# Patient Record
Sex: Female | Born: 1945 | Race: Black or African American | Hispanic: No | Marital: Single | Smoking: Current every day smoker
Health system: Southern US, Community
[De-identification: ages and names within clinical notes are randomized; demographics above are authoritative.]

## PROBLEM LIST (undated history)

## (undated) DIAGNOSIS — K259 Gastric ulcer, unspecified as acute or chronic, without hemorrhage or perforation: Secondary | ICD-10-CM

## (undated) DIAGNOSIS — K219 Gastro-esophageal reflux disease without esophagitis: Secondary | ICD-10-CM

## (undated) DIAGNOSIS — K449 Diaphragmatic hernia without obstruction or gangrene: Secondary | ICD-10-CM

## (undated) DIAGNOSIS — Z972 Presence of dental prosthetic device (complete) (partial): Secondary | ICD-10-CM

## (undated) DIAGNOSIS — I10 Essential (primary) hypertension: Secondary | ICD-10-CM

---

## 2006-03-10 ENCOUNTER — Emergency Department: Payer: Self-pay | Admitting: Emergency Medicine

## 2007-07-27 ENCOUNTER — Ambulatory Visit: Payer: Self-pay | Admitting: Family Medicine

## 2007-10-02 ENCOUNTER — Emergency Department: Payer: Self-pay | Admitting: Emergency Medicine

## 2008-02-22 ENCOUNTER — Emergency Department: Payer: Self-pay | Admitting: Emergency Medicine

## 2012-09-29 ENCOUNTER — Emergency Department: Payer: Self-pay | Admitting: Emergency Medicine

## 2012-09-29 LAB — URINALYSIS, COMPLETE
Bilirubin,UR: NEGATIVE
Glucose,UR: NEGATIVE mg/dL (ref 0–75)
Ketone: NEGATIVE
Leukocyte Esterase: NEGATIVE
Nitrite: NEGATIVE
Ph: 8 (ref 4.5–8.0)
Protein: NEGATIVE
RBC,UR: 1 /HPF (ref 0–5)
WBC UR: 2 /HPF (ref 0–5)

## 2012-09-29 LAB — COMPREHENSIVE METABOLIC PANEL
Alkaline Phosphatase: 92 U/L (ref 50–136)
Anion Gap: 4 — ABNORMAL LOW (ref 7–16)
BUN: 10 mg/dL (ref 7–18)
Bilirubin,Total: 0.5 mg/dL (ref 0.2–1.0)
Creatinine: 0.63 mg/dL (ref 0.60–1.30)
EGFR (Non-African Amer.): >60
Potassium: 3.5 mmol/L (ref 3.5–5.1)
SGOT(AST): 34 U/L (ref 15–37)
SGPT (ALT): 37 U/L (ref 12–78)
Total Protein: 7.8 g/dL (ref 6.4–8.2)

## 2012-09-29 LAB — CBC
HCT: 39.4 % (ref 35.0–47.0)
HGB: 13.3 g/dL (ref 12.0–16.0)
MCV: 90 fL (ref 80–100)
RBC: 4.38 10*6/uL (ref 3.80–5.20)
RDW: 13.3 % (ref 11.5–14.5)

## 2012-09-29 LAB — TSH: Thyroid Stimulating Horm: 0.62 u[IU]/mL

## 2016-11-28 ENCOUNTER — Encounter: Payer: Self-pay | Admitting: Emergency Medicine

## 2016-11-28 ENCOUNTER — Emergency Department: Payer: Medicaid Other

## 2016-11-28 ENCOUNTER — Emergency Department
Admission: EM | Admit: 2016-11-28 | Discharge: 2016-11-28 | Disposition: A | Discharge status: Home / Self Care | Payer: Medicaid Other | Attending: Emergency Medicine | Admitting: Emergency Medicine

## 2016-11-28 DIAGNOSIS — F17200 Nicotine dependence, unspecified, uncomplicated: Secondary | ICD-10-CM | POA: Insufficient documentation

## 2016-11-28 DIAGNOSIS — Y9389 Activity, other specified: Secondary | ICD-10-CM | POA: Insufficient documentation

## 2016-11-28 DIAGNOSIS — W458XXA Other foreign body or object entering through skin, initial encounter: Secondary | ICD-10-CM | POA: Insufficient documentation

## 2016-11-28 DIAGNOSIS — Y929 Unspecified place or not applicable: Secondary | ICD-10-CM | POA: Diagnosis not present

## 2016-11-28 DIAGNOSIS — R05 Cough: Secondary | ICD-10-CM | POA: Insufficient documentation

## 2016-11-28 DIAGNOSIS — S80851A Superficial foreign body, right lower leg, initial encounter: Secondary | ICD-10-CM | POA: Diagnosis not present

## 2016-11-28 DIAGNOSIS — J40 Bronchitis, not specified as acute or chronic: Secondary | ICD-10-CM

## 2016-11-28 DIAGNOSIS — Y998 Other external cause status: Secondary | ICD-10-CM | POA: Diagnosis not present

## 2016-11-28 DIAGNOSIS — S80921A Unspecified superficial injury of right lower leg, initial encounter: Secondary | ICD-10-CM | POA: Diagnosis present

## 2016-11-28 MED ORDER — PREDNISONE 50 MG PO TABS: 50.0000 mg | ORAL_TABLET | Freq: Every day | ORAL | 0 refills | Status: DC

## 2016-11-28 MED ORDER — PSEUDOEPH-BROMPHEN-DM 30-2-10 MG/5ML PO SYRP: 10.0000 mL | ORAL_SOLUTION | Freq: Four times a day (QID) | ORAL | 0 refills | Status: DC | PRN

## 2016-11-28 MED ORDER — LIDOCAINE HCL (PF) 1 % IJ SOLN
10.0000 mL | Freq: Once | INTRAMUSCULAR | Status: AC
  Administered 2016-11-28: 10 mL
  Filled 2016-11-28: qty 10

## 2016-11-28 MED ORDER — CEPHALEXIN 500 MG PO CAPS: 500.0000 mg | ORAL_CAPSULE | Freq: Four times a day (QID) | ORAL | 0 refills | Status: DC

## 2016-11-28 NOTE — ED Provider Notes (Signed)
[facility] Emergency Department Provider Note  ____________________________________________  Time seen: Approximately 9:40 PM  I have reviewed the triage vital signs and the nursing notes.   HISTORY  Chief Complaint Foreign Body in Skin    HPI Loretta Wilkins is a 71 y.o. female who presents emergency department complaining of fishhook in her right lower leg. Patient reports that she was fishing when she accidentally snagged her right lower extremity with the fishhook. Patient attempted to remove at home but was unsuccessful. Tetanus shot 3 months ago.  Patient is also requesting evaluation of cough 3 weeks. She denieshis congestion, sore throat, fevers or chills, difficulty breathing. No history of COPD. No history of CHF. Patient has not tried medications at home.   History reviewed. No pertinent past medical history.  There are no active problems to display for this patient.   History reviewed. No pertinent surgical history.  Prior to Admission medications   Medication Sig Start Date End Date Taking? Authorizing Provider  brompheniramine-pseudoephedrine-DM 30-2-10 MG/5ML syrup Take 10 mLs by mouth 4 (four) times daily as needed. 11/28/16   Cuthriell, Delorise Royals, PA-C  cephALEXin (KEFLEX) 500 MG capsule Take 1 capsule (500 mg total) by mouth 4 (four) times daily. 11/28/16   Cuthriell, Delorise Royals, PA-C  predniSONE (DELTASONE) 50 MG tablet Take 1 tablet (50 mg total) by mouth daily with breakfast. 11/28/16   Cuthriell, Delorise Royals, PA-C    Allergies Patient has no known allergies.  No family history on file.  Social History Social History  Substance Use Topics  . Smoking status: Current Every Day Smoker  . Smokeless tobacco: Never Used  . Alcohol use No     Review of Systems  Constitutional: No fever/chills Eyes: No visual changes. No discharge ENT: No upper respiratory complaints. Cardiovascular: no chest pain. Respiratory: Positive  cough. No SOB. Gastrointestinal: No abdominal pain.  No nausea, no vomiting. Musculoskeletal: Negative for musculoskeletal pain. Skin: Negative for rash, abrasions, lacerations, ecchymosis. Positive for embedded fish hook to the right lower extremity Neurological: Negative for headaches, focal weakness or numbness. 10-point ROS otherwise negative.  ____________________________________________   PHYSICAL EXAM:  VITAL SIGNS: ED Triage Vitals  Enc Vitals Group     BP 11/28/16 1923 (!) 138/112     Pulse Rate 11/28/16 1923 89     Resp 11/28/16 1923 18     Temp 11/28/16 1923 98.8 F (37.1 C)     Temp Source 11/28/16 1923 Oral     SpO2 11/28/16 1923 98 %     Weight 11/28/16 1924 107 lb (48.5 kg)     Height 11/28/16 1924  (1.651 m)     Head Circumference --      Peak Flow --      Pain Score 11/28/16 1923 3     Pain Loc --      Pain Edu? --      Excl. in GC? --      Constitutional: Alert and oriented. Well appearing and in no acute distress. Eyes: Conjunctivae are normal. PERRL. EOMI. Head: Atraumatic. ENT:      Ears:       Nose: No congestion/rhinnorhea.      Mouth/Throat: Mucous membranes are moist.  Neck: No stridor.   Hematological/Lymphatic/Immunilogical: No cervical lymphadenopathy. Cardiovascular: Normal rate, regular rhythm. Normal S1 and S2.  Good peripheral circulation. Respiratory: Normal respiratory effort without tachypnea or retractions. Lungs with a few scattered coarse breath sounds. No wheezes, rales, rhonchi. No crackles.Peri Jefferson  air entry to the bases with no decreased or absent breath sounds. Musculoskeletal: Full range of motion to all extremities. No gross deformities appreciated. Neurologic:  Normal speech and language. No gross focal neurologic deficits are appreciated.  Skin:  Skin is warm, dry and intact. No rash noted. Embedded fishhook to the right lower extremity. Part and is completely area didn't skin. No bleeding at this time. Psychiatric: Mood  and affect are normal. Speech and behavior are normal. Patient exhibits appropriate insight and judgement.   ____________________________________________   LABS (all labs ordered are listed, but only abnormal results are displayed)  Labs Reviewed - No data to display ____________________________________________  EKG   ____________________________________________  RADIOLOGY Festus Barren Cuthriell, personally viewed and evaluated these images (plain radiographs) as part of my medical decision making, as well as reviewing the written report by the radiologist.  Dg Chest 2 View  Result Date: 11/28/2016 CLINICAL DATA:  Productive cough for 3 weeks EXAM: CHEST  2 VIEW COMPARISON:  09/29/2012 FINDINGS: Normal heart size and pulmonary vascularity. No focal airspace disease or consolidation in the lungs. No blunting of costophrenic angles. No pneumothorax. Mediastinal contours appear intact. 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 Degenerative changes in the spine and shoulders. IMPRESSION: No active cardiopulmonary disease. Electronically Signed   By: Burman Nieves M.D.   On: 11/28/2016 22:28    ____________________________________________    PROCEDURES  Procedure(s) performed:    .Foreign Body Removal Date/Time: 11/28/2016 10:56 PM Performed by: Gala Romney D Authorized by: Gala Romney D  Consent: Verbal consent obtained. Risks and benefits: risks, benefits and alternatives were discussed Consent given by: patient Patient understanding: patient states understanding of the procedure being performed Patient identity confirmed: verbally with patient Body area: skin General location: lower extremity Location details: right lower leg Anesthesia: local infiltration  Anesthesia: Local Anesthetic: lidocaine 1% without epinephrine Anesthetic total: 3 mL  Sedation: Patient sedated: no Patient restrained: no Patient cooperative: yes Localization  method: visualized Removal mechanism: forceps Dressing: dressing applied Tendon involvement: none Depth: subcutaneous Complexity: simple 1 objects recovered. Objects recovered: fishhook Post-procedure assessment: foreign body removed Comments: Areas anesthetized using lidocaine. The fishhook is forced to the skin using forceps, barb is removed using an electric ring cutter, remaining fishhook is retracted back into the skin. Dressing applied. Patient tolerated well.      Medications  lidocaine (PF) (XYLOCAINE) 1 % injection 10 mL (10 mLs Infiltration Given by Other 11/28/16 2231)     ____________________________________________   INITIAL IMPRESSION / ASSESSMENT AND PLAN / ED COURSE  Pertinent labs & imaging results that were available during my care of the patient were reviewed by me and considered in my medical decision making (see chart for details).  Review of the [place] CSRS was performed in accordance of the [facility] prior to dispensing any controlled drugs.     Patient's diagnosis is consistent with Embedded foreign body to the right lower extremity and bronchitis. Patient presented with an embedded fishhook which is successfully removed as described above. Patient tolerated well. She will be placed on antibiotics prophylactically. Patient status shot was up-to-date and not up-to-date at this time. Chest x-ray reveals no areas of consolidation consistent with pneumonia. Exam is reassuring. Patient does have bronchitis we treated with prednisone and cough medication.. Patient will follow up primary care as needed. Patient is given ED precautions to return to the ED for any worsening or new symptoms.     ____________________________________________  FINAL CLINICAL IMPRESSION(S) / ED DIAGNOSES  Final diagnoses:  Foreign body of right lower leg, initial encounter  Bronchitis      NEW MEDICATIONS STARTED DURING THIS VISIT:  Discharge Medication List as of 11/28/2016 11:06 PM     START taking these medications   Details  brompheniramine-pseudoephedrine-DM 30-2-10 MG/5ML syrup Take 10 mLs by mouth 4 (four) times daily as needed., Starting Mon 11/28/2016, Print    cephALEXin (KEFLEX) 500 MG capsule Take 1 capsule (500 mg total) by mouth 4 (four) times daily., Starting Mon 11/28/2016, Print    predniSONE (DELTASONE) 50 MG tablet Take 1 tablet (50 mg total) by mouth daily with breakfast., Starting Mon 11/28/2016, Print            This chart was dictated using voice recognition software/Dragon. Despite best efforts to proofread, errors can occur which can change the meaning. Any change was purely unintentional.    Racheal Patches, PA-C 11/28/16 2340    Rockne Menghini, MD 12/01/16 [phone]

## 2016-11-28 NOTE — ED Notes (Signed)

## 2016-11-28 NOTE — ED Triage Notes (Signed)
Patient to ER for c/o fishing hook stuck in right outer lower leg.

## 2016-11-28 NOTE — ED Notes (Signed)
Pt reports that she has a fishing hook in her right lower leg Pt also states that she has "a bad chest cold" - c/o productive cough (thick clear phlegm)

## 2017-01-13 ENCOUNTER — Encounter: Payer: Self-pay | Admitting: Emergency Medicine

## 2017-01-13 ENCOUNTER — Emergency Department: Payer: Medicare Other

## 2017-01-13 ENCOUNTER — Emergency Department
Admission: EM | Admit: 2017-01-13 | Discharge: 2017-01-13 | Disposition: A | Discharge status: Home / Self Care | Payer: Medicare Other | Attending: Emergency Medicine | Admitting: Emergency Medicine

## 2017-01-13 DIAGNOSIS — S8002XA Contusion of left knee, initial encounter: Secondary | ICD-10-CM

## 2017-01-13 DIAGNOSIS — Y939 Activity, unspecified: Secondary | ICD-10-CM | POA: Insufficient documentation

## 2017-01-13 DIAGNOSIS — W19XXXA Unspecified fall, initial encounter: Secondary | ICD-10-CM | POA: Diagnosis not present

## 2017-01-13 DIAGNOSIS — F17210 Nicotine dependence, cigarettes, uncomplicated: Secondary | ICD-10-CM | POA: Diagnosis not present

## 2017-01-13 DIAGNOSIS — Y92512 Supermarket, store or market as the place of occurrence of the external cause: Secondary | ICD-10-CM | POA: Diagnosis not present

## 2017-01-13 DIAGNOSIS — Y998 Other external cause status: Secondary | ICD-10-CM | POA: Insufficient documentation

## 2017-01-13 DIAGNOSIS — S39012A Strain of muscle, fascia and tendon of lower back, initial encounter: Secondary | ICD-10-CM

## 2017-01-13 DIAGNOSIS — S8992XA Unspecified injury of left lower leg, initial encounter: Secondary | ICD-10-CM | POA: Diagnosis present

## 2017-01-13 MED ORDER — TRAMADOL-ACETAMINOPHEN 37.5-325 MG PO TABS: 1.0000 | ORAL_TABLET | Freq: Four times a day (QID) | ORAL | 0 refills | Status: DC | PRN

## 2017-01-13 NOTE — ED Triage Notes (Signed)
FIRST NURSE NOTE-here because fell in [employer] this morning. Knee pain wants to get it looked at

## 2017-01-13 NOTE — ED Triage Notes (Signed)
Pt states was in [employer] reading a bottle when she slipped in vomit. Pt states she fell backwards and landed on her L hip. Pt states when she tried to get up her foot slipped out from under her and landed on her L knee. Pt c/o L knee, hip, elbow, and L lower back pain.

## 2017-01-13 NOTE — ED Provider Notes (Signed)
[facility] Emergency Department Provider Note   ____________________________________________   First MD Initiated Contact with Patient 01/13/17 1301     (approximate)  I have reviewed the triage vital signs and the nursing notes.   HISTORY  Chief Complaint Fall and Back Pain    HPI Loretta Wilkins is a 71 y.o. female patient complaining of left knee pain secondary to a fall which occurred at [facility] this morning. Patient states able to ambulate and weight-bear with moderate discomfort.Patient also complaining of left lateral back pain. No palliative measures for complaint. Patient denies radicular component to her back pain. Patient denies bladder bowel dysfunction.   History reviewed. No pertinent past medical history.  There are no active problems to display for this patient.   History reviewed. No pertinent surgical history.  Prior to Admission medications   Medication Sig Start Date End Date Taking? Authorizing Provider  brompheniramine-pseudoephedrine-DM 30-2-10 MG/5ML syrup Take 10 mLs by mouth 4 (four) times daily as needed. 11/28/16   Cuthriell, Delorise Royals, PA-C  cephALEXin (KEFLEX) 500 MG capsule Take 1 capsule (500 mg total) by mouth 4 (four) times daily. 11/28/16   Cuthriell, Delorise Royals, PA-C  predniSONE (DELTASONE) 50 MG tablet Take 1 tablet (50 mg total) by mouth daily with breakfast. 11/28/16   Cuthriell, Delorise Royals, PA-C  traMADol-acetaminophen (ULTRACET) 37.5-325 MG tablet Take 1 tablet by mouth every 6 (six) hours as needed. 01/13/17   Joni Reining, PA-C    Allergies Patient has no known allergies.  No family history on file.  Social History Social History  Substance Use Topics  . Smoking status: Current Every Day Smoker    Packs/day: 1.00    Types: Cigarettes  . Smokeless tobacco: Never Used  . Alcohol use No    Review of Systems Constitutional: No fever/chills Eyes: No visual changes. ENT: No sore  throat. Cardiovascular: Denies chest pain. Respiratory: Denies shortness of breath. Gastrointestinal: No abdominal pain.  No nausea, no vomiting.  No diarrhea.  No constipation. Genitourinary: Negative for dysuria. Musculoskeletal: Left anterior knee pain  Skin: Negative for rash. Neurological: Negative for headaches, focal weakness or numbness.   ____________________________________________   PHYSICAL EXAM:  VITAL SIGNS: ED Triage Vitals [01/13/17 1258]  Enc Vitals Group     BP (!) 145/85     Pulse Rate (!) 102     Resp 18     Temp 98.2 F (36.8 C)     Temp Source Oral     SpO2 97 %     Weight      Height      Head Circumference      Peak Flow      Pain Score      Pain Loc      Pain Edu?      Excl. in GC?    Constitutional: Alert and oriented. Well appearing and in no acute distress. Neck: No stridor.  No cervical spine tenderness to palpation. Cardiovascular: Normal rate, regular rhythm. Grossly normal heart sounds.  Good peripheral circulation. Respiratory: Normal respiratory effort.  No retractions. Lungs CTAB. Musculoskeletal:  deformity lumbar spine. Patient is moderate guarding palpation of the paraspinal muscle area. Patient has full l range of motion of the lumbar spine. Patient is negative straight leg test. No deformity to the left knee. Mild crepitus palpation. No obvious effusion. Patient has full equal range of motion.  Neurologic:  Normal speech and language. No gross focal neurologic deficits are appreciated. No gait instability.  Skin:  Skin is warm, dry and intact. No rash noted. Psychiatric: Mood and affect are normal. Speech and behavior are normal.  ____________________________________________   LABS (all labs ordered are listed, but only abnormal results are displayed)  Labs Reviewed - No data to display ____________________________________________  EKG   ____________________________________________  RADIOLOGY  Dg Knee Complete 4 Views  Left  Result Date: 01/13/2017 CLINICAL DATA:  71 y/o  F; fall with left lateral knee pain. EXAM: LEFT KNEE - COMPLETE 4+ VIEW COMPARISON:  None. FINDINGS: No evidence of fracture, dislocation, or joint effusion. Spurring of tibial spines. Superior patellar enthesophyte. Soft tissues are unremarkable. IMPRESSION: 1. No acute fracture or dislocation identified. 2. Minimal osteoarthrosis of the knee. Electronically Signed   By: Mitzi HansenLance  Furusawa-Stratton M.D.   On: 01/13/2017 13:53   No acute findings x-ray of the left knee. ____________________________________________   PROCEDURES  Procedure(s) performed: None  Procedures  Critical Care performed: No  ____________________________________________   INITIAL IMPRESSION / ASSESSMENT AND PLAN / ED COURSE  As part of my medical decision making, I reviewed the following data within the electronic MEDICAL RECORD NUMBER    Patient presents with back and left knee [facility] Discussed x-ray finding with patient. Patient given discharge care instructions and advised [facility] if condition persists.      ____________________________________________   FINAL CLINICAL IMPRESSION(S) / ED DIAGNOSES  Final diagnoses:  Contusion of left knee, initial encounter  Strain of lumbar region, initial encounter      NEW MEDICATIONS STARTED DURING THIS VISIT:  New Prescriptions   TRAMADOL-ACETAMINOPHEN (ULTRACET) 37.5-325 MG TABLET    Take 1 tablet by mouth every 6 (six) hours as needed.     Note:  This document was prepared using Dragon voice recognition software and may include unintentional dictation errors.    Joni ReiningSmith, Ronald K, PA-C 01/13/17 1408    Schaevitz, Myra Rudeavid Matthew, MD 01/13/17 [phone]

## 2017-01-24 ENCOUNTER — Emergency Department
Admission: EM | Admit: 2017-01-24 | Discharge: 2017-01-24 | Disposition: A | Discharge status: Home / Self Care | Payer: Medicare Other | Attending: Student in an Organized Health Care Education/Training Program | Admitting: Student in an Organized Health Care Education/Training Program

## 2017-01-24 ENCOUNTER — Other Ambulatory Visit: Payer: Self-pay

## 2017-01-24 ENCOUNTER — Emergency Department: Payer: Medicare Other

## 2017-01-24 DIAGNOSIS — Y929 Unspecified place or not applicable: Secondary | ICD-10-CM | POA: Diagnosis not present

## 2017-01-24 DIAGNOSIS — M545 Low back pain: Secondary | ICD-10-CM | POA: Diagnosis not present

## 2017-01-24 DIAGNOSIS — X58XXXA Exposure to other specified factors, initial encounter: Secondary | ICD-10-CM | POA: Insufficient documentation

## 2017-01-24 DIAGNOSIS — Y999 Unspecified external cause status: Secondary | ICD-10-CM | POA: Insufficient documentation

## 2017-01-24 DIAGNOSIS — Z79899 Other long term (current) drug therapy: Secondary | ICD-10-CM | POA: Insufficient documentation

## 2017-01-24 DIAGNOSIS — F17210 Nicotine dependence, cigarettes, uncomplicated: Secondary | ICD-10-CM | POA: Diagnosis not present

## 2017-01-24 DIAGNOSIS — Y9389 Activity, other specified: Secondary | ICD-10-CM | POA: Diagnosis not present

## 2017-01-24 DIAGNOSIS — M5417 Radiculopathy, lumbosacral region: Secondary | ICD-10-CM | POA: Diagnosis not present

## 2017-01-24 DIAGNOSIS — M25552 Pain in left hip: Secondary | ICD-10-CM | POA: Diagnosis present

## 2017-01-24 NOTE — ED Triage Notes (Signed)
Pt states about 11 days ago she fell on floor at [employer]. C/o L leg, knee, hip, low back pain that wraps around to abd. Pt states she has been walking around at home. Alert and oriented.

## 2017-01-24 NOTE — Discharge Instructions (Signed)
Please follow up with her primary care provider if symptoms are not improving over the week.  You may have some improvement of your pain if you use a heating pad.  Return to the emergency department for symptoms that change or worsen if you're unable to schedule an appointment.

## 2017-01-24 NOTE — ED Provider Notes (Signed)
[facility] Emergency Department Provider Note ____________________________________________  Time seen: Approximately 3:23 PM  I have reviewed the triage vital signs and the nursing notes.   HISTORY  Chief Complaint Fall    HPI Loretta Wilkins is a 71 y.o. female who presents to the emergency department for treatment and evaluation of left side hip and lower back pain after sustaining a mechanical, non-syncopal fall11 days ago. She was evaluated here after the incident and an image of her knee was obtained at that time which did not show any abnormalities. Since then, she has been evaluated by her primary care provider who recommended that she come to the hospital for additional images. She presents today with outpatient orders, but requests to be seen in the emergency department instead.  History reviewed. No pertinent past medical history.  There are no active problems to display for this patient.   History reviewed. No pertinent surgical history.  Prior to Admission medications   Medication Sig Start Date End Date Taking? Authorizing Provider  brompheniramine-pseudoephedrine-DM 30-2-10 MG/5ML syrup Take 10 mLs by mouth 4 (four) times daily as needed. 11/28/16   Cuthriell, Delorise RoyalsJonathan D, PA-C  cephALEXin (KEFLEX) 500 MG capsule Take 1 capsule (500 mg total) by mouth 4 (four) times daily. 11/28/16   Cuthriell, Delorise RoyalsJonathan D, PA-C  predniSONE (DELTASONE) 50 MG tablet Take 1 tablet (50 mg total) by mouth daily with breakfast. 11/28/16   Cuthriell, Delorise RoyalsJonathan D, PA-C  traMADol-acetaminophen (ULTRACET) 37.5-325 MG tablet Take 1 tablet by mouth every 6 (six) hours as needed. 01/13/17   Joni ReiningSmith, Ronald K, PA-C    Allergies Patient has no known allergies.  History reviewed. No pertinent family history.  Social History Social History   Tobacco Use  . Smoking status: Current Every Day Smoker    Packs/day: 1.00    Types: Cigarettes  . Smokeless tobacco: Never Used   Substance Use Topics  . Alcohol use: No  . Drug use: No    Review of Systems Constitutional: Negative for recent illness. Cardiovascular: Negative for chest pain Respiratory: Negative for shortness of breath Musculoskeletal: Positive for left lower back and left hip pain Skin: Negative for rash, lesion, or when  Neurological: Positive for intermittent radiculopathy into the left hip  ____________________________________________   PHYSICAL EXAM:  VITAL SIGNS: ED Triage Vitals  Enc Vitals Group     BP 01/24/17 1233 (!) 134/109     Pulse Rate 01/24/17 1233 (!) 103     Resp 01/24/17 1233 18     Temp 01/24/17 1233 98.8 F (37.1 C)     Temp Source 01/24/17 1233 Oral     SpO2 01/24/17 1233 98 %     Weight 01/24/17 1234 170 lb (77.1 kg)     Height 01/24/17 1234 5\' 5"  (1.651 m)     Head Circumference --      Peak Flow --      Pain Score 01/24/17 1233 8     Pain Loc --      Pain Edu? --      Excl. in GC? --     Constitutional: Alert and oriented. Well appearing and in no acute distress. Eyes: Conjunctivae are clear without discharge or drainage  Head: Atraumatic Neck: Full, active range of motion was observed Respiratory: Respirations are even and unlabored. Breath sounds are clear to auscultation. Musculoskeletal: Tenderness elicited with palpation over the left lumbar area and hip. Patient is able to bear weight, turn over in bed, and sit up  without assistance. Neurologic: Straight leg raise is negative bilaterally.  Skin: No wound noted on exposed skin surfaces.  Psychiatric: Behavior and affect are appropriate.  ____________________________________________   LABS (all labs ordered are listed, but only abnormal results are displayed)  Labs Reviewed - No data to display ____________________________________________  RADIOLOGY  Exam of the lumbar spine reveals no acute compression fracture or other acute bony abnormalities. Exam of the left hip is also negative for  acute bony abnormalities per radiology. ____________________________________________   PROCEDURES  Procedure(s) performed: None  ____________________________________________   INITIAL IMPRESSION / ASSESSMENT AND PLAN / ED COURSE  Loretta Wilkins is a 71 y.o. female who presents to the emergency department for evaluation and treatment of back and hip pain after sustaining a mechanical, non-syncopal fall about 11 days ago. She will be encouraged to continue taking the medications as prescribed by her primary care provider and at her ER visit when the injury occurred. She will be instructed to follow-up with her primary care provider for symptoms that are not improving over the next week or so. She was instructed to return to the emergency department if her symptoms change or worsen and she is unable schedule an appointment.  Pertinent labs & imaging results that were available during my care of the patient were reviewed by me and considered in my medical decision making (see chart for details).  _________________________________________   FINAL CLINICAL IMPRESSION(S) / ED DIAGNOSES  Final diagnoses:  Radicular pain of lumbosacral region    If controlled substance prescribed during this visit, 12 month history viewed on the [place] prior to issuing an initial prescription for Schedule II or III opiod.    Chinita Pesterriplett, Orville Widmann B, FNP 01/24/17 1531    Willy Eddyobinson, Patrick, MD 01/24/17 [phone]

## 2017-02-07 ENCOUNTER — Other Ambulatory Visit: Payer: Self-pay | Admitting: Family Medicine

## 2017-02-07 DIAGNOSIS — Z1231 Encounter for screening mammogram for malignant neoplasm of breast: Secondary | ICD-10-CM

## 2017-11-22 ENCOUNTER — Other Ambulatory Visit: Payer: Self-pay | Admitting: Family Medicine

## 2017-11-22 DIAGNOSIS — Z1231 Encounter for screening mammogram for malignant neoplasm of breast: Secondary | ICD-10-CM

## 2018-02-12 ENCOUNTER — Observation Stay
Admission: EM | Admit: 2018-02-12 | Discharge: 2018-02-14 | Disposition: A | Discharge status: Home / Self Care | Payer: Medicare HMO | Attending: Internal Medicine | Admitting: Internal Medicine

## 2018-02-12 ENCOUNTER — Observation Stay: Payer: Medicare HMO | Admitting: Certified Registered Nurse Anesthetist

## 2018-02-12 ENCOUNTER — Encounter: Payer: Self-pay | Admitting: Medical Oncology

## 2018-02-12 ENCOUNTER — Other Ambulatory Visit: Payer: Self-pay

## 2018-02-12 ENCOUNTER — Emergency Department: Payer: Medicare HMO

## 2018-02-12 DIAGNOSIS — K2210 Ulcer of esophagus without bleeding: Principal | ICD-10-CM | POA: Insufficient documentation

## 2018-02-12 DIAGNOSIS — K449 Diaphragmatic hernia without obstruction or gangrene: Secondary | ICD-10-CM | POA: Insufficient documentation

## 2018-02-12 DIAGNOSIS — I252 Old myocardial infarction: Secondary | ICD-10-CM | POA: Diagnosis not present

## 2018-02-12 DIAGNOSIS — E876 Hypokalemia: Secondary | ICD-10-CM | POA: Diagnosis not present

## 2018-02-12 DIAGNOSIS — Z79899 Other long term (current) drug therapy: Secondary | ICD-10-CM | POA: Diagnosis not present

## 2018-02-12 DIAGNOSIS — Z8711 Personal history of peptic ulcer disease: Secondary | ICD-10-CM | POA: Insufficient documentation

## 2018-02-12 DIAGNOSIS — K2100 Gastro-esophageal reflux disease with esophagitis, without bleeding: Secondary | ICD-10-CM

## 2018-02-12 DIAGNOSIS — F17210 Nicotine dependence, cigarettes, uncomplicated: Secondary | ICD-10-CM | POA: Diagnosis not present

## 2018-02-12 DIAGNOSIS — E860 Dehydration: Secondary | ICD-10-CM | POA: Diagnosis not present

## 2018-02-12 DIAGNOSIS — Z8719 Personal history of other diseases of the digestive system: Secondary | ICD-10-CM | POA: Insufficient documentation

## 2018-02-12 DIAGNOSIS — K920 Hematemesis: Secondary | ICD-10-CM

## 2018-02-12 DIAGNOSIS — R0789 Other chest pain: Secondary | ICD-10-CM | POA: Diagnosis not present

## 2018-02-12 HISTORY — DX: Gastric ulcer, unspecified as acute or chronic, without hemorrhage or perforation: K25.9

## 2018-02-12 HISTORY — DX: Diaphragmatic hernia without obstruction or gangrene: K44.9

## 2018-02-12 HISTORY — DX: Gastro-esophageal reflux disease without esophagitis: K21.9

## 2018-02-12 LAB — LIPASE, BLOOD: Lipase: 37 U/L (ref 11–51)

## 2018-02-12 LAB — COMPREHENSIVE METABOLIC PANEL
ALK PHOS: 73 U/L (ref 38–126)
ALT: 30 U/L (ref 0–44)
ANION GAP: 16 — AB (ref 5–15)
AST: 37 U/L (ref 15–41)
Albumin: 4.3 g/dL (ref 3.5–5.0)
BILIRUBIN TOTAL: 1 mg/dL (ref 0.3–1.2)
BUN: 37 mg/dL — ABNORMAL HIGH (ref 8–23)
CALCIUM: 10.5 mg/dL — AB (ref 8.9–10.3)
CO2: 34 mmol/L — AB (ref 22–32)
CREATININE: 0.87 mg/dL (ref 0.44–1.00)
Chloride: 94 mmol/L — ABNORMAL LOW (ref 98–111)
GFR calc non Af Amer: >60 mL/min (ref >60)
Glucose, Bld: 138 mg/dL — ABNORMAL HIGH (ref 70–99)
Potassium: 3.5 mmol/L (ref 3.5–5.1)
Sodium: 144 mmol/L (ref 135–145)
TOTAL PROTEIN: 9.2 g/dL — AB (ref 6.5–8.1)

## 2018-02-12 LAB — TYPE AND SCREEN
ABO/RH(D): O POS
Antibody Screen: NEGATIVE

## 2018-02-12 LAB — HEMOGLOBIN AND HEMATOCRIT, BLOOD
HCT: 41.9 % (ref 36.0–46.0)
Hemoglobin: 13.7 g/dL (ref 12.0–15.0)

## 2018-02-12 LAB — CBC WITH DIFFERENTIAL/PLATELET
ABS IMMATURE GRANULOCYTES: 0.09 10*3/uL — AB (ref 0.00–0.07)
Basophils Absolute: 0 10*3/uL (ref 0.0–0.1)
Basophils Relative: 0 %
EOS PCT: 0 %
Eosinophils Absolute: 0 10*3/uL (ref 0.0–0.5)
HEMATOCRIT: 48.8 % — AB (ref 36.0–46.0)
HEMOGLOBIN: 15.8 g/dL — AB (ref 12.0–15.0)
Immature Granulocytes: 1 %
LYMPHS PCT: 10 %
Lymphs Abs: 1 10*3/uL (ref 0.7–4.0)
MCH: 28.6 pg (ref 26.0–34.0)
MCHC: 32.4 g/dL (ref 30.0–36.0)
MCV: 88.2 fL (ref 80.0–100.0)
MONO ABS: 0.7 10*3/uL (ref 0.1–1.0)
MONOS PCT: 7 %
NEUTROS ABS: 8.4 10*3/uL — AB (ref 1.7–7.7)
Neutrophils Relative %: 82 %
Platelets: 226 10*3/uL (ref 150–400)
RBC: 5.53 MIL/uL — ABNORMAL HIGH (ref 3.87–5.11)
RDW: 15.7 % — ABNORMAL HIGH (ref 11.5–15.5)
WBC: 10.2 10*3/uL (ref 4.0–10.5)
nRBC: 0 % (ref 0.0–0.2)

## 2018-02-12 LAB — TROPONIN I

## 2018-02-12 LAB — HEMOGLOBIN: Hemoglobin: 15.3 g/dL — ABNORMAL HIGH (ref 12.0–15.0)

## 2018-02-12 MED ORDER — HYDRALAZINE HCL 20 MG/ML IJ SOLN
10.0000 mg | Freq: Four times a day (QID) | INTRAMUSCULAR | Status: DC | PRN
  Administered 2018-02-12 – 2018-02-13 (×2): 10 mg via INTRAVENOUS
  Filled 2018-02-12 (×2): qty 1

## 2018-02-12 MED ORDER — SODIUM CHLORIDE 0.9 % IV SOLN
INTRAVENOUS | Status: DC
  Administered 2018-02-12 – 2018-02-13 (×2): via INTRAVENOUS
  Administered 2018-02-13: 1000 mL via INTRAVENOUS

## 2018-02-12 MED ORDER — SODIUM CHLORIDE 0.9 % IV SOLN
80.0000 mg | Freq: Once | INTRAVENOUS | Status: AC
  Administered 2018-02-12: 80 mg via INTRAVENOUS
  Filled 2018-02-12: qty 80

## 2018-02-12 MED ORDER — ONDANSETRON HCL 4 MG/2ML IJ SOLN
4.0000 mg | Freq: Four times a day (QID) | INTRAMUSCULAR | Status: DC | PRN
  Administered 2018-02-12 – 2018-02-13 (×2): 4 mg via INTRAVENOUS
  Filled 2018-02-12 (×2): qty 2

## 2018-02-12 MED ORDER — SODIUM CHLORIDE 0.9 % IV SOLN
Freq: Once | INTRAVENOUS | Status: AC
  Administered 2018-02-12: 12:00:00 via INTRAVENOUS

## 2018-02-12 MED ORDER — ONDANSETRON HCL 4 MG/2ML IJ SOLN
4.0000 mg | Freq: Once | INTRAMUSCULAR | Status: AC
  Administered 2018-02-12: 4 mg via INTRAVENOUS
  Filled 2018-02-12: qty 2

## 2018-02-12 MED ORDER — PANTOPRAZOLE SODIUM 40 MG IV SOLR: 40.0000 mg | Freq: Two times a day (BID) | INTRAVENOUS | Status: DC

## 2018-02-12 MED ORDER — MORPHINE SULFATE (PF) 2 MG/ML IV SOLN
2.0000 mg | INTRAVENOUS | Status: DC | PRN
  Administered 2018-02-12 (×2): 2 mg via INTRAVENOUS
  Filled 2018-02-12 (×2): qty 1

## 2018-02-12 MED ORDER — HYDRALAZINE HCL 20 MG/ML IJ SOLN
INTRAMUSCULAR | Status: AC
  Filled 2018-02-12: qty 1

## 2018-02-12 MED ORDER — SODIUM CHLORIDE 0.9 % IV SOLN
8.0000 mg/h | INTRAVENOUS | Status: DC
  Administered 2018-02-12 – 2018-02-13 (×4): 8 mg/h via INTRAVENOUS
  Filled 2018-02-12 (×5): qty 80

## 2018-02-12 NOTE — Progress Notes (Signed)
Advanced care plan.  Purpose of the Encounter: CODE STATUS  Parties in Attendance: Patient herself  Patient's Decision Capacity: Intact  Subjective/Patient's story:  Patient 72 year old with history of GERD and gastric ulcers presents with hematemesis with possible GI bleed  Objective/Medical story Due to patient's advanced age I discussed with her regarding her desires for cardiac and pulmonary resuscitation   Goals of care determination:  Patient states that she would like everything to be done including CPR intubation   CODE STATUS:  Full code  Time spent discussing advanced care planning: 16 minutes

## 2018-02-12 NOTE — ED Provider Notes (Signed)
[facility] Emergency Department Provider Note       Time seen: ----------------------------------------- 9:10 AM on 02/12/2018 -----------------------------------------   I have reviewed the triage vital signs and the nursing notes.  HISTORY   Chief Complaint No chief complaint on file.    HPI Loretta Wilkins is a 72 y.o. female with a history of hiatal hernia, gastric ulcers and GERD who presents to the ED for metaphysis.  Patient reports she is been vomiting since Thursday, which is 5 days ago.  She has been unable to keep anything down during that time.  Typically every time she throws up she throws up a lot of blood.  She feels weak, reports vomiting dark-colored emesis.  She reports soreness to her abdomen and chest.  She had one bowel movement this morning that was dark.  She reports she had restarted some of her acid reflux medicine but it caused her to vomit.  No past medical history on file.  There are no active problems to display for this patient.   No past surgical history on file.  Allergies Patient has no known allergies.  Social History Social History   Tobacco Use  . Smoking status: Current Every Day Smoker    Packs/day: 1.00    Types: Cigarettes  . Smokeless tobacco: Never Used  Substance Use Topics  . Alcohol use: No  . Drug use: No   Review of Systems Constitutional: Negative for fever. Cardiovascular: Negative for chest pain. Respiratory: Negative for shortness of breath. Gastrointestinal: Positive for abdominal soreness, hematemesis Musculoskeletal: Negative for back pain. Skin: Negative for rash. Neurological: Negative for headaches, positive for weakness  All systems negative/normal/unremarkable except as stated in the HPI  ____________________________________________   PHYSICAL EXAM:  VITAL SIGNS: ED Triage Vitals  Enc Vitals Group     BP      Pulse      Resp      Temp      Temp src      SpO2    Weight      Height      Head Circumference      Peak Flow      Pain Score      Pain Loc      Pain Edu?      Excl. in GC?    Constitutional: Alert and oriented. Well appearing and in no distress. Eyes: Conjunctivae are normal. Normal extraocular movements. ENT   Head: Normocephalic and atraumatic.   Nose: No congestion/rhinnorhea.   Mouth/Throat: Mucous membranes are moist.   Neck: No stridor. Cardiovascular: Normal rate, regular rhythm. No murmurs, rubs, or gallops. Respiratory: Normal respiratory effort without tachypnea nor retractions. Breath sounds are clear and equal bilaterally. No wheezes/rales/rhonchi. Gastrointestinal: Soft and nontender. Normal bowel sounds Musculoskeletal: Nontender with normal range of motion in extremities. No lower extremity tenderness nor edema. Neurologic:  Normal speech and language. No gross focal neurologic deficits are appreciated.  Skin:  Skin is warm, dry and intact. No rash noted. Psychiatric: Mood and affect are normal. Speech and behavior are normal.  ____________________________________________  EKG: Interpreted by me.  Sinus tachycardia with a rate of 104 bpm, left atrial enlargement, left axis deviation, possible anterior infarct is old  ____________________________________________  ED COURSE:  As part of my medical decision making, I reviewed the following data within the electronic MEDICAL RECORD NUMBER History obtained from family if available, nursing notes, old chart and ekg, as well as notes from prior ED visits. Patient presented  for hematemesis, we will assess with labs and imaging as indicated at this time.   Procedures ____________________________________________   LABS (pertinent positives/negatives)  Labs Reviewed  CBC WITH DIFFERENTIAL/PLATELET - Abnormal; Notable for the following components:      Result Value   RBC 5.53 (*)    Hemoglobin 15.8 (*)    HCT 48.8 (*)    RDW 15.7 (*)    Neutro Abs 8.4 (*)     Abs Immature Granulocytes 0.09 (*)    All other components within normal limits  COMPREHENSIVE METABOLIC PANEL - Abnormal; Notable for the following components:   Chloride 94 (*)    CO2 34 (*)    Glucose, Bld 138 (*)    BUN 37 (*)    Calcium 10.5 (*)    Total Protein 9.2 (*)    Anion gap 16 (*)    All other components within normal limits  LIPASE, BLOOD  TROPONIN I  TYPE AND SCREEN   CRITICAL CARE Performed by: Ulice Dash   Total critical care time: 30 minutes  Critical care time was exclusive of separately billable procedures and treating other patients.  Critical care was necessary to treat or prevent imminent or life-threatening deterioration.  Critical care was time spent personally by me on the following activities: development of treatment plan with patient and/or surrogate as well as nursing, discussions with consultants, evaluation of patient's response to treatment, examination of patient, obtaining history from patient or surrogate, ordering and performing treatments and interventions, ordering and review of laboratory studies, ordering and review of radiographic studies, pulse oximetry and re-evaluation of patient's condition.  RADIOLOGY Images were viewed by me  Acute abdominal series Does not reveal any acute process ____________________________________________  DIFFERENTIAL DIAGNOSIS   Gastric ulcer, gastritis, anemia, acute blood loss, hiatal hernia, medication noncompliance  FINAL ASSESSMENT AND PLAN  Hematemesis   Plan: The patient had presented for hematemesis.  Patient was started on IV Protonix by bolus and continuous infusion.  She also received IV Zofran.  Patient's labs presently did not reveal any anemia but her BUN is elevated suggestive of GI bleeding. Patient's imaging was unremarkable.  I will discuss with the hospitalist for admission and likely GI consultation.   Ulice Dash, MD   Note: This note was generated in part  or whole with voice recognition software. Voice recognition is usually quite accurate but there are transcription errors that can and very often do occur. I apologize for any typographical errors that were not detected and corrected.     Emily Filbert, MD 02/12/18 1054

## 2018-02-12 NOTE — ED Notes (Signed)
Pt visualized in NAD, requesting pillow and water. EDP at bedside at this time. This RN explained patient NPO until further notice. Pt states understanding. WIll continue to monitor for further patient needs.

## 2018-02-12 NOTE — H&P (Signed)
[employer] [facility] [place] [facility]   PATIENT NAME: Loretta Wilkins    MR#:  [id number]  DATE OF BIRTH:  June 23, 1945  DATE OF ADMISSION:  02/12/2018  PRIMARY CARE PHYSICIAN: [facility]   REQUESTING/REFERRING PHYSICIAN: Emily FilbertWilliams, Jonathan E, MD  CHIEF COMPLAINT:   Chief Complaint  Patient presents with  . Hematemesis    HISTORY OF PRESENT ILLNESS: Loretta Wilkins  is a 72 y.o. female with a known history of GERD, hiatal hernia, gastric ulcers in the past presents with throwing up dark material for the past 2 days.  Patient states that she thinks it may have been blood.  She has had multiple episodes of this.  She states that since coming to the ER has stopped.  Patient states that she does take ibuprofen occasionally but does not take NSAIDs on a routine basis.  She otherwise denies any chest pain or shortness of breath  PAST MEDICAL HISTORY:   Past Medical History:  Diagnosis Date  . GERD (gastroesophageal reflux disease)   . Hiatal hernia   . Multiple gastric ulcers     PAST SURGICAL HISTORY: History reviewed. No pertinent surgical history.  SOCIAL HISTORY:  Social History   Tobacco Use  . Smoking status: Current Every Day Smoker    Packs/day: 1.00    Types: Cigarettes  . Smokeless tobacco: Never Used  Substance Use Topics  . Alcohol use: No    FAMILY HISTORY:  Family History  Problem Relation Age of Onset  . Hypertension Mother     DRUG ALLERGIES: No Known Allergies  REVIEW OF SYSTEMS:   CONSTITUTIONAL: No fever, fatigue or weakness.  EYES: No blurred or double vision.  EARS, NOSE, AND THROAT: No tinnitus or ear pain.  RESPIRATORY: No cough, shortness of breath, wheezing or hemoptysis.  CARDIOVASCULAR: No chest pain, orthopnea, edema.  GASTROINTESTINAL: Positive nausea, positive vomiting, no diarrhea or abdominal pain.  GENITOURINARY: No dysuria, hematuria.  ENDOCRINE: No polyuria, nocturia,  HEMATOLOGY: No anemia, easy  bruising or bleeding SKIN: No rash or lesion. MUSCULOSKELETAL: No joint pain or arthritis.   NEUROLOGIC: No tingling, numbness, weakness.  PSYCHIATRY: No anxiety or depression.   MEDICATIONS AT HOME:  Prior to Admission medications   Medication Sig Start Date End Date Taking? Authorizing Provider  Omeprazole 20 MG TBDD Take 20 mg by mouth daily as needed.   Yes [provider]  brompheniramine-pseudoephedrine-DM 30-2-10 MG/5ML syrup Take 10 mLs by mouth 4 (four) times daily as needed. Patient not taking: Reported on 02/12/2018 11/28/16   Cuthriell, Delorise RoyalsJonathan D, PA-C  cephALEXin (KEFLEX) 500 MG capsule Take 1 capsule (500 mg total) by mouth 4 (four) times daily. Patient not taking: Reported on 02/12/2018 11/28/16   Cuthriell, Delorise RoyalsJonathan D, PA-C  traMADol-acetaminophen (ULTRACET) 37.5-325 MG tablet Take 1 tablet by mouth every 6 (six) hours as needed. 01/13/17   Joni ReiningSmith, Ronald K, PA-C      PHYSICAL EXAMINATION:   VITAL SIGNS: Blood pressure (!) 132/97, pulse (!) 129, temperature (!) 97.5 F (36.4 C), temperature source Oral, resp. rate (!) 24, weight 77 kg, SpO2 97 %.  GENERAL:  72 y.o.-year-old patient lying in the bed with no acute distress.  EYES: Pupils equal, round, reactive to light and accommodation. No scleral icterus. Extraocular muscles intact.  HEENT: Head atraumatic, normocephalic. Oropharynx and nasopharynx clear.  NECK:  Supple, no jugular venous distention. No thyroid enlargement, no tenderness.  LUNGS: Normal breath sounds bilaterally, no wheezing, rales,rhonchi or crepitation. No use of accessory  muscles of respiration.  CARDIOVASCULAR: S1, S2 normal. No murmurs, rubs, or gallops.  ABDOMEN: Soft, nontender, nondistended. Bowel sounds present. No organomegaly or mass.  EXTREMITIES: No pedal edema, cyanosis, or clubbing.  NEUROLOGIC: Cranial nerves II through XII are intact. Muscle strength 5/5 in all extremities. Sensation intact. Gait not checked.  PSYCHIATRIC: The  patient is alert and oriented x 3.  SKIN: No obvious rash, lesion, or ulcer.   LABORATORY PANEL:   CBC Recent Labs  Lab 02/12/18 0916  WBC 10.2  HGB 15.8*  HCT 48.8*  PLT 226  MCV 88.2  MCH 28.6  MCHC 32.4  RDW 15.7*  LYMPHSABS 1.0  MONOABS 0.7  EOSABS 0.0  BASOSABS 0.0   ------------------------------------------------------------------------------------------------------------------  Chemistries  Recent Labs  Lab 02/12/18 0916  NA 144  K 3.5  CL 94*  CO2 34*  GLUCOSE 138*  BUN 37*  CREATININE 0.87  CALCIUM 10.5*  AST 37  ALT 30  ALKPHOS 73  BILITOT 1.0   ------------------------------------------------------------------------------------------------------------------ CrCl cannot be calculated (Unknown ideal weight.). ------------------------------------------------------------------------------------------------------------------ No results for input(s): TSH, T4TOTAL, T3FREE, THYROIDAB in the last 72 hours.  Invalid input(s): FREET3   Coagulation profile No results for input(s): INR, PROTIME in the last 168 hours. ------------------------------------------------------------------------------------------------------------------- No results for input(s): DDIMER in the last 72 hours. -------------------------------------------------------------------------------------------------------------------  Cardiac Enzymes Recent Labs  Lab 02/12/18 0916  TROPONINI <0.03   ------------------------------------------------------------------------------------------------------------------ Invalid input(s): POCBNP  ---------------------------------------------------------------------------------------------------------------  Urinalysis    Component Value Date/Time   COLORURINE Straw 09/29/2012 1247   APPEARANCEUR Clear 09/29/2012 1247   LABSPEC 1.005 09/29/2012 1247   PHURINE 8.0 09/29/2012 1247   GLUCOSEU Negative 09/29/2012 1247   HGBUR Negative  09/29/2012 1247   BILIRUBINUR Negative 09/29/2012 1247   KETONESUR Negative 09/29/2012 1247   PROTEINUR Negative 09/29/2012 1247   NITRITE Negative 09/29/2012 1247   LEUKOCYTESUR Negative 09/29/2012 1247     RADIOLOGY: Dg Abdomen Acute W/chest  Result Date: 02/12/2018 CLINICAL DATA:  Vomiting for 5 days. EXAM: DG ABDOMEN ACUTE W/ 1V CHEST COMPARISON:  Chest x-ray 11/28/2016 FINDINGS: The upright chest x-ray demonstrates normal cardiomediastinal silhouette. Mild tortuosity of the thoracic aorta. There is a moderate to large hiatal hernia again demonstrated. No acute pulmonary findings. Two views of the abdomen demonstrate an unremarkable bowel gas pattern. No findings to suggest obstruction or perforation. The soft tissue shadows are maintained. No worrisome calcifications. Scattered vascular calcifications. The bony structures are intact. IMPRESSION: 1. No acute cardiopulmonary findings. 2. Moderate to large hiatal hernia. 3. No plain film findings for an acute abdominal process. Electronically Signed   By: Rudie Meyer M.D.   On: 02/12/2018 11:15    EKG: Orders placed or performed during the hospital encounter of 02/12/18  . EKG 12-Lead  . EKG 12-Lead    IMPRESSION AND PLAN: Patient is 72 year old presenting with emesis  1.  Upper GI bleed due to gastritis and/or gastric ulcer Continue n.p.o. Status IV fluid Check hemoglobin transfuse as needed I have sent the GI physician on call a message regarding patient's admission  2.  Elevated anion gap likely due to dehydration we will give IV fluids   3.  Hypercalcemia likely due to dehydration we will follow her calcium in the morning  4.  Nicotine abuse smoking cessation provided 4 minutes spent strongly recommend she stop smoking nicotine patch offered patient did not want a nicotine patch  All the records are reviewed and case discussed with ED provider. Management plans discussed with the patient, family and they are  in  agreement.  CODE STATUS: Full code    TOTAL TIME TAKING CARE OF THIS PATIENT: 55 minutes.    Auburn Bilberry M.D on 02/12/2018 at 11:45 AM  Between 7am to 6pm - Pager - [phone]  After 6pm go to [email or web address] - password [email or web address]  [employer] Office  [phone]  CC: Primary care physician; [facility] [employer]

## 2018-02-12 NOTE — ED Triage Notes (Signed)
Pt reports since Thursday she has been vomiting dark colored emesis. Pt reports soreness to abd and chest. Denies diarrhea, one BM this am was dark.

## 2018-02-12 NOTE — ED Notes (Signed)
Pt transported to floor by Wilford Sportsassie, RN.

## 2018-02-12 NOTE — Consult Note (Signed)
Cephas Darby, MD [address]  [place]  [place], [place] [address]  Main: [phone]  Fax: [phone] Pager: [phone]   Consultation  Referring Provider:     No ref. provider found Primary Care Physician:  [facility], [facility] Primary Gastroenterologist: None         Reason for Consultation:     Coffee-ground emesis  Date of Admission:  02/12/2018 Date of Consultation:  02/12/2018         HPI:   Loretta Wilkins is a 72 y.o. female with history of peptic ulcer disease, hiatal hernia presented to ER today secondary to several episodes of coffee-ground emesis since Thursday associated with upper abdominal pain and chest discomfort.  Patient had dark bowel movement today.  She was found to have elevated BUN, however, her hemoglobin was 15.8 with normal platelets.  Baseline hemoglobin 13.3 in 09/2012.  EKG and troponin were unremarkable, lipase was normal, LFTs normal.  Patient is started on pantoprazole drip and GI is consulted for further evaluation Patient started taking omeprazole that she had at home since onset of the symptoms. Patient does not have history of cirrhosis or chronic liver disease Patient has moderate to large hiatal hernia based on chest x-ray today Patient reports that she was found to have gastric ulcers several years ago.  She reports having had an upper endoscopy in [place].  NSAIDs: She has been taking ibuprofen 2-3 times a week up to 3 pills a day for headaches  Antiplts/Anticoagulants/Anti thrombotics: None  GI Procedures: Unknown  Past Medical History:  Diagnosis Date  . GERD (gastroesophageal reflux disease)   . Hiatal hernia   . Multiple gastric ulcers     History reviewed. No pertinent surgical history.  Prior to Admission medications   Medication Sig Start Date End Date Taking? Authorizing Provider  Omeprazole 20 MG TBDD Take 20 mg by mouth daily as needed.   Yes [provider]    brompheniramine-pseudoephedrine-DM 30-2-10 MG/5ML syrup Take 10 mLs by mouth 4 (four) times daily as needed. Patient not taking: Reported on 02/12/2018 11/28/16   Cuthriell, Charline Bills, PA-C  cephALEXin (KEFLEX) 500 MG capsule Take 1 capsule (500 mg total) by mouth 4 (four) times daily. Patient not taking: Reported on 02/12/2018 11/28/16   Cuthriell, Charline Bills, PA-C  traMADol-acetaminophen (ULTRACET) 37.5-325 MG tablet Take 1 tablet by mouth every 6 (six) hours as needed. 01/13/17   Sable Feil, PA-C    Current Facility-Administered Medications:  .  0.9 %  sodium chloride infusion, , Intravenous, Continuous, Dustin Flock, MD .  hydrALAZINE (APRESOLINE) injection 10 mg, 10 mg, Intravenous, Q6H PRN, Dustin Flock, MD, 10 mg at 02/12/18 1259 .  morphine 2 MG/ML injection 2 mg, 2 mg, Intravenous, Q4H PRN, Dustin Flock, MD, 2 mg at 02/12/18 1437 .  ondansetron (ZOFRAN) injection 4 mg, 4 mg, Intravenous, Q6H PRN, Dustin Flock, MD, 4 mg at 02/12/18 1436 .  pantoprazole (PROTONIX) 80 mg in sodium chloride 0.9 % 250 mL (0.32 mg/mL) infusion, 8 mg/hr, Intravenous, Continuous, Dustin Flock, MD, Last Rate: 25 mL/hr at 02/12/18 1001, 8 mg/hr at 02/12/18 1001 .  [START ON 02/15/2018] pantoprazole (PROTONIX) injection 40 mg, 40 mg, Intravenous, Q12H, Dustin Flock, MD  Family History  Problem Relation Age of Onset  . Hypertension Mother      Social History   Tobacco Use  . Smoking status: Current Every Day Smoker    Packs/day: 1.00    Types: Cigarettes  .  Smokeless tobacco: Never Used  Substance Use Topics  . Alcohol use: No  . Drug use: No    Allergies as of 02/12/2018  . (No Known Allergies)    Review of Systems:    All systems reviewed and negative except where noted in HPI.   Physical Exam:  Vital signs in last 24 hours: Temp:  [97.5 F (36.4 C)-98.8 F (37.1 C)] 98.8 F (37.1 C) (12/02 1337) Pulse Rate:  [107-129] 119 (12/02 1337) Resp:  [18-24] 24 (12/02  1337) BP: (132-184)/(92-132) 173/95 (12/02 1337) SpO2:  [90 %-97 %] 96 % (12/02 1337) Weight:  [66.4 kg-77 kg] 66.4 kg (12/02 1337) Last BM Date: 02/12/18 General:   Pleasant, cooperative in NAD Head:  Normocephalic and atraumatic. Eyes:   No icterus.   Conjunctiva pink. PERRLA. Ears:  Normal auditory acuity. Neck:  Supple; no masses or thyroidomegaly Lungs: Respirations even and unlabored. Lungs clear to auscultation bilaterally.   No wheezes, crackles, or rhonchi.  Heart:  Regular rate and rhythm;  Without murmur, clicks, rubs or gallops Abdomen:  Soft, nondistended, nontender. Normal bowel sounds. No appreciable masses or hepatomegaly.  No rebound or guarding.  Rectal:  Not performed. Msk:  Symmetrical without gross deformities.  Strength normal Extremities:  Without edema, cyanosis or clubbing. Neurologic:  Alert and oriented x3;  grossly normal neurologically. Skin:  Intact without significant lesions or rashes. Psych:  Alert and cooperative. Normal affect.  LAB RESULTS: CBC Latest Ref Rng & Units 02/12/2018 02/12/2018 09/29/2012  WBC 4.0 - 10.5 K/uL - 10.2 4.5  Hemoglobin 12.0 - 15.0 g/dL 15.3(H) 15.8(H) 13.3  Hematocrit 36.0 - 46.0 % - 48.8(H) 39.4  Platelets 150 - 400 K/uL - 226 205    BMET BMP Latest Ref Rng & Units 02/12/2018 09/29/2012  Glucose 70 - 99 mg/dL 138(H) 76  BUN 8 - 23 mg/dL 37(H) 10  Creatinine 0.44 - 1.00 mg/dL 0.87 0.63  Sodium 135 - 145 mmol/L 144 140  Potassium 3.5 - 5.1 mmol/L 3.5 3.5  Chloride 98 - 111 mmol/L 94(L) 107  CO2 22 - 32 mmol/L 34(H) 29  Calcium 8.9 - 10.3 mg/dL 10.5(H) 9.1    LFT Hepatic Function Latest Ref Rng & Units 02/12/2018 09/29/2012  Total Protein 6.5 - 8.1 g/dL 9.2(H) 7.8  Albumin 3.5 - 5.0 g/dL 4.3 3.3(L)  AST 15 - 41 U/L 37 34  ALT 0 - 44 U/L 30 37  Alk Phosphatase 38 - 126 U/L 73 92  Total Bilirubin 0.3 - 1.2 mg/dL 1.0 0.5     STUDIES: Dg Abdomen Acute W/chest  Result Date: 02/12/2018 CLINICAL DATA:  Vomiting for 5  days. EXAM: DG ABDOMEN ACUTE W/ 1V CHEST COMPARISON:  Chest x-ray 11/28/2016 FINDINGS: The upright chest x-ray demonstrates normal cardiomediastinal silhouette. Mild tortuosity of the thoracic aorta. There is a moderate to large hiatal hernia again demonstrated. No acute pulmonary findings. Two views of the abdomen demonstrate an unremarkable bowel gas pattern. No findings to suggest obstruction or perforation. The soft tissue shadows are maintained. No worrisome calcifications. Scattered vascular calcifications. The bony structures are intact. IMPRESSION: 1. No acute cardiopulmonary findings. 2. Moderate to large hiatal hernia. 3. No plain film findings for an acute abdominal process. Electronically Signed   By: Marijo Sanes M.D.   On: 02/12/2018 11:15      Impression / Plan:   Loretta Wilkins is a 72 y.o. African-American female with known history of peptic ulcer disease, hiatal hernia presented with 4 days history of  coffee-ground emesis and dark stools associated with elevated BUN.  However with normal hemoglobin.  Patient might be hemoconcentrated at this time.  X-ray chest and abdomen revealed moderate to large hiatal hernia.  There was no evidence of free air on KUB.  Patient has history of NSAID use  Upper GI bleed: Continue pantoprazole drip Check H&H now Okay with clear liquid diet N.p.o. past midnight EGD today Monitor CBC and transfuse as needed  I have discussed alternative options, risks & benefits,  which include, but are not limited to, bleeding, infection, perforation,respiratory complication & drug reaction.  The patient agrees with this plan & written consent will be obtained.     Thank you for involving me in the care of this patient.      LOS: 0 days   Sherri Sear, MD  02/12/2018, 4:25 PM   Note: This dictation was prepared with Dragon dictation along with smaller phrase technology. Any transcriptional errors that result from this process are unintentional.

## 2018-02-12 NOTE — ED Notes (Signed)
Admitting MD paged regarding patient's BP 184/109. Will await return phone call prior to transporting patient to floor.

## 2018-02-13 ENCOUNTER — Encounter
Admission: EM | Disposition: A | Discharge status: Home / Self Care | Payer: Self-pay | Source: Home / Self Care | Attending: Emergency Medicine

## 2018-02-13 DIAGNOSIS — K2210 Ulcer of esophagus without bleeding: Secondary | ICD-10-CM | POA: Diagnosis not present

## 2018-02-13 LAB — CBC
HCT: 39.4 % (ref 36.0–46.0)
Hemoglobin: 12.6 g/dL (ref 12.0–15.0)
MCH: 28.8 pg (ref 26.0–34.0)
MCHC: 32 g/dL (ref 30.0–36.0)
MCV: 90 fL (ref 80.0–100.0)
PLATELETS: 164 10*3/uL (ref 150–400)
RBC: 4.38 MIL/uL (ref 3.87–5.11)
RDW: 15.5 % (ref 11.5–15.5)
WBC: 7 10*3/uL (ref 4.0–10.5)
nRBC: 0 % (ref 0.0–0.2)

## 2018-02-13 LAB — BASIC METABOLIC PANEL
Anion gap: 8 (ref 5–15)
BUN: 22 mg/dL (ref 8–23)
CO2: 30 mmol/L (ref 22–32)
CREATININE: 0.67 mg/dL (ref 0.44–1.00)
Calcium: 8.5 mg/dL — ABNORMAL LOW (ref 8.9–10.3)
Chloride: 102 mmol/L (ref 98–111)
GFR calc Af Amer: >60 mL/min (ref >60)
GFR calc non Af Amer: >60 mL/min (ref >60)
Glucose, Bld: 91 mg/dL (ref 70–99)
Potassium: 2.7 mmol/L — CL (ref 3.5–5.1)
Sodium: 140 mmol/L (ref 135–145)

## 2018-02-13 LAB — POTASSIUM
Potassium: 3.9 mmol/L (ref 3.5–5.1)
Potassium: 3.1 mmol/L — ABNORMAL LOW (ref 3.5–5.1)

## 2018-02-13 LAB — MAGNESIUM: Magnesium: 1.9 mg/dL (ref 1.7–2.4)

## 2018-02-13 LAB — GLUCOSE, CAPILLARY: Glucose-Capillary: 79 mg/dL (ref 70–99)

## 2018-02-13 SURGERY — EGD (ESOPHAGOGASTRODUODENOSCOPY)
Anesthesia: General

## 2018-02-13 MED ORDER — MOMETASONE FURO-FORMOTEROL FUM 200-5 MCG/ACT IN AERO
2.0000 | INHALATION_SPRAY | Freq: Two times a day (BID) | RESPIRATORY_TRACT | Status: DC
  Administered 2018-02-13 – 2018-02-14 (×3): 2 via RESPIRATORY_TRACT
  Filled 2018-02-13: qty 8.8

## 2018-02-13 MED ORDER — POTASSIUM CHLORIDE 10 MEQ/100ML IV SOLN
10.0000 meq | INTRAVENOUS | Status: AC
  Administered 2018-02-13 (×4): 10 meq via INTRAVENOUS
  Filled 2018-02-13 (×3): qty 100

## 2018-02-13 MED ORDER — POTASSIUM CHLORIDE 20 MEQ PO PACK
40.0000 meq | PACK | Freq: Once | ORAL | Status: AC
  Administered 2018-02-13: 09:00:00 40 meq via ORAL
  Filled 2018-02-13: qty 2

## 2018-02-13 MED ORDER — ACETAMINOPHEN 325 MG PO TABS: 650.0000 mg | ORAL_TABLET | Freq: Four times a day (QID) | ORAL | Status: DC | PRN

## 2018-02-13 MED ORDER — MORPHINE SULFATE (PF) 2 MG/ML IV SOLN: 2.0000 mg | Freq: Four times a day (QID) | INTRAVENOUS | Status: DC | PRN

## 2018-02-13 MED ORDER — POTASSIUM CHLORIDE 20 MEQ PO PACK
20.0000 meq | PACK | ORAL | Status: AC
  Administered 2018-02-13 (×2): 20 meq via ORAL
  Filled 2018-02-13 (×2): qty 1

## 2018-02-13 MED ORDER — SODIUM CHLORIDE 0.9 % IV SOLN: INTRAVENOUS | Status: DC

## 2018-02-13 MED ORDER — POTASSIUM CHLORIDE 20 MEQ PO PACK
40.0000 meq | PACK | Freq: Two times a day (BID) | ORAL | Status: DC
  Administered 2018-02-13: 40 meq via ORAL
  Filled 2018-02-13: qty 2

## 2018-02-13 MED ORDER — HYDROCOD POLST-CPM POLST ER 10-8 MG/5ML PO SUER
5.0000 mL | Freq: Two times a day (BID) | ORAL | Status: DC
  Administered 2018-02-13 (×2): 5 mL via ORAL
  Filled 2018-02-13 (×2): qty 5

## 2018-02-13 NOTE — Care Management Obs Status (Signed)
MEDICARE OBSERVATION STATUS NOTIFICATION   Patient Details  Name: Loretta PorterRosie S Skow MRN: [id number] Date of Birth: Jul 13, 1945   Medicare Observation Status Notification Given:  Yes    Gwenette GreetBrenda S Noor Witte, RN 02/13/2018, 9:42 AM

## 2018-02-13 NOTE — Progress Notes (Signed)
Pharmacy Electrolyte Monitoring Consult:  Pharmacy consulted to assist in monitoring and replacing electrolytes in this 72 y.o. female admitted on 02/12/2018 with Hematemesis   Labs:  Sodium (mmol/L)  Date Value  02/13/2018 140  09/29/2012 140   Potassium (mmol/L)  Date Value  02/13/2018 3.1 (L)  09/29/2012 3.5   Magnesium (mg/dL)  Date Value  19/14/782912/05/2017 1.9   Calcium (mg/dL)  Date Value  56/21/308612/05/2017 8.5 (L)   Calcium, Total (mg/dL)  Date Value  57/84/696207/19/2014 9.1   Albumin (g/dL)  Date Value  95/28/413212/04/2017 4.3  09/29/2012 3.3 (L)    Assessment/Plan: K 2.7 this AM with recheck of 3.9 after KCl 40 mEq PO x2 doses and 4 runs of IV 10 mEq  Rechecked at 1700 per MD (suspected falsely elevated due to proximity to K runs)   Standing KCl order stopped. Per MD, supplement potassium if low.   When rechecked 12/3 @ 1700, potassium was 3.1  Gave 20 meq po x 2 - Recheck with 12/4 am labs and replete if low.  Pharmacy will continue to follow.   Albina Billetharles M Andi Mahaffy, PharmD Clinical Pharmacist 02/13/2018 6:18 PM

## 2018-02-13 NOTE — Progress Notes (Signed)
CRITICAL VALUE STICKER  CRITICAL VALUE: Potassium 2.7  RECEIVER (on-site recipient of call):Cung Masterson  DATE & TIME NOTIFIED: 02/13/18, 0515  MESSENGER (representative from lab):  MD NOTIFIED: Dr Sheryle Hailiamond  TIME OF NOTIFICATION:02/13/18 0520  RESPONSE: New orders written

## 2018-02-13 NOTE — Progress Notes (Signed)
Pharmacy Electrolyte Monitoring Consult:  Pharmacy consulted to assist in monitoring and replacing electrolytes in this 72 y.o. female admitted on 02/12/2018 with Hematemesis   Labs:  Sodium (mmol/L)  Date Value  02/13/2018 140  09/29/2012 140   Potassium (mmol/L)  Date Value  02/13/2018 3.9  09/29/2012 3.5   Magnesium (mg/dL)  Date Value  16/10/960412/05/2017 1.9   Calcium (mg/dL)  Date Value  54/09/811912/05/2017 8.5 (L)   Calcium, Total (mg/dL)  Date Value  14/78/295607/19/2014 9.1   Albumin (g/dL)  Date Value  21/30/865712/04/2017 4.3  09/29/2012 3.3 (L)    Assessment/Plan: K 2.7 this AM with recheck of 3.9 after KCl 40 mEq PO x2 doses and 4 runs of IV 10 mEq  Will recheck at 1700 per MD orders as the K of 3.9 may be falsely elevated due to proximity to K runs. Standing KCl order stopped. Per MD, supplement potassium if low.   Pharmacy will continue to follow.   Marty HeckWang, Leobardo Granlund L 02/13/2018 3:13 PM

## 2018-02-13 NOTE — Progress Notes (Signed)
[facility] - [place] at [facility]   PATIENT NAME: Loretta KaufmannRosie Wilkins    MR#:  [id number]  DATE OF BIRTH:  11-07-1945  SUBJECTIVE:  CHIEF COMPLAINT:   Chief Complaint  Patient presents with  . Hematemesis   -Came in with throwing up blood.  Complains of significant cough and wheezing today.  REVIEW OF SYSTEMS:  Review of Systems  Constitutional: Negative for chills, fever and malaise/fatigue.  HENT: Negative for hearing loss.   Respiratory: Positive for cough and shortness of breath. Negative for wheezing.   Cardiovascular: Negative for chest pain and palpitations.  Gastrointestinal: Negative for abdominal pain, constipation, diarrhea, nausea and vomiting.       Hematemesis  Genitourinary: Negative for dysuria.  Musculoskeletal: Negative for myalgias.  Neurological: Negative for dizziness, focal weakness, seizures, weakness and headaches.  Psychiatric/Behavioral: Negative for depression.    DRUG ALLERGIES:  No Known Allergies  VITALS:  Blood pressure (!) 154/91, pulse 99, temperature 98.9 F (37.2 C), temperature source Oral, resp. rate (!) 22, height 5\' 4"  (1.626 m), weight 66.4 kg, SpO2 97 %.  PHYSICAL EXAMINATION:  Physical Exam   GENERAL:  72 y.o.-year-old patient lying in the bed with no acute distress.  EYES: Pupils equal, round, reactive to light and accommodation. No scleral icterus. Extraocular muscles intact.  HEENT: Head atraumatic, normocephalic. Oropharynx and nasopharynx clear.  NECK:  Supple, no jugular venous distention. No thyroid enlargement, no tenderness.  LUNGS: Normal breath sounds bilaterally, no wheezing, rales,rhonchi or crepitation.  Does have some minimal expiratory wheezing all over the lung fields.  No use of accessory muscles of respiration.  CARDIOVASCULAR: S1, S2 normal. No murmurs, rubs, or gallops.  ABDOMEN: Soft, nontender, nondistended. Bowel sounds present. No organomegaly or mass.  EXTREMITIES: No pedal edema,  cyanosis, or clubbing.  NEUROLOGIC: Cranial nerves II through XII are intact. Muscle strength 5/5 in all extremities. Sensation intact. Gait not checked.  PSYCHIATRIC: The patient is alert and oriented x 3.  SKIN: No obvious rash, lesion, or ulcer.    LABORATORY PANEL:   CBC Recent Labs  Lab 02/13/18 0423  WBC 7.0  HGB 12.6  HCT 39.4  PLT 164   ------------------------------------------------------------------------------------------------------------------  Chemistries  Recent Labs  Lab 02/12/18 0916 02/13/18 0423  NA 144 140  K 3.5 2.7*  CL 94* 102  CO2 34* 30  GLUCOSE 138* 91  BUN 37* 22  CREATININE 0.87 0.67  CALCIUM 10.5* 8.5*  AST 37  --   ALT 30  --   ALKPHOS 73  --   BILITOT 1.0  --    ------------------------------------------------------------------------------------------------------------------  Cardiac Enzymes Recent Labs  Lab 02/12/18 0916  TROPONINI <0.03   ------------------------------------------------------------------------------------------------------------------  RADIOLOGY:  Dg Abdomen Acute W/chest  Result Date: 02/12/2018 CLINICAL DATA:  Vomiting for 5 days. EXAM: DG ABDOMEN ACUTE W/ 1V CHEST COMPARISON:  Chest x-ray 11/28/2016 FINDINGS: The upright chest x-ray demonstrates normal cardiomediastinal silhouette. Mild tortuosity of the thoracic aorta. There is a moderate to large hiatal hernia again demonstrated. No acute pulmonary findings. Two views of the abdomen demonstrate an unremarkable bowel gas pattern. No findings to suggest obstruction or perforation. The soft tissue shadows are maintained. No worrisome calcifications. Scattered vascular calcifications. The bony structures are intact. IMPRESSION: 1. No acute cardiopulmonary findings. 2. Moderate to large hiatal hernia. 3. No plain film findings for an acute abdominal process. Electronically Signed   By: Rudie MeyerP.  Gallerani M.D.   On: 02/12/2018 11:15    EKG:   Orders placed or  performed during the hospital encounter of 02/12/18  . EKG 12-Lead  . EKG 12-Lead    ASSESSMENT AND PLAN:   72 year old female with past medical history significant for peptic ulcer disease, hiatal hernia and prior history of gastric ulcers presents to hospital secondary to melena and hematemesis.  1.  Upper GI bleed-melena and hematemesis -Prior history of gastric ulcers.  Uses NSAIDs at home -Continue IV Protonix -Appreciate GI consult -For EGD today. -Chest x-ray with moderate to large hiatal hernia  2.  Hypokalemia-secondary to GI losses.  Being replaced aggressively. -Check a magnesium level  3.  Acute anemia-secondary to GI losses.  Hemoglobin dropped from 15 to 12.6, partly dilutional. -Stable, no active bleeding.  Monitor  4.  Dry cough and wheezing- cxr with no acute findings - will add cough meds and inhalers  5. DVT Prophylaxis- TEDs and SCDs No heparin products due to GI bleed   All the records are reviewed and case discussed with Care Management/Social Workerr. Management plans discussed with the patient, family and they are in agreement.  CODE STATUS: Full Code  TOTAL TIME TAKING CARE OF THIS PATIENT: 39 minutes.   POSSIBLE D/C IN 1-2 DAYS, DEPENDING ON CLINICAL CONDITION.   Enid Baas M.D on 02/13/2018 at 10:30 AM  Between 7am to 6pm - Pager - [phone]  After 6pm go to [email or web address] - Social research officer, government  [facility]  Office  [phone]  CC: Primary care physician; [employer]

## 2018-02-13 NOTE — Care Management Note (Signed)
Case Management Note  Patient Details  Name: Rolland PorterRosie S Bowdish MRN: [id number] Date of Birth: [birth year]  Subjective/Objective:    Admitted to [facility] under observation status with the diagnosis of upper GI Bleed. Lives with roommate. Son is Delila Pereyrayrone [phone]). Last seen Dr. Rinaldo CloudSelvage at [facility] 2 months ago. Pres[facility].  No home Health. No skilled facility. No home oxygen. Rolling walker, wheelchair, and crutches in the home if needed. Marcy Salvo(Raymond, common law husband expired 3 weeks ago).  Takes care of all basic activities of daily living herself, drives. No falls. Decreased appetite since [place] expired. Daughter/son will transport                Action/Plan: No transition of care needs identified at this time Updated insurance care faxed to insurance verification.   Expected Discharge Date:                  Expected Discharge Plan:     In-House Referral:     Discharge planning Services     Post Acute Care Choice:    Choice offered to:     DME Arranged:    DME Agency:     HH Arranged:    HH Agency:     Status of Service:     If discussed at [employer] of [employer] Meetings, dates discussed:    Additional Comments:  Gwenette GreetBrenda S Marcel Sorter, RN MSN CCM Care Management [phone] 02/13/2018, 10:26 AM

## 2018-02-13 NOTE — Progress Notes (Signed)
Pt drank orange juice today. Procedure cancelled in pre op.

## 2018-02-14 ENCOUNTER — Encounter
Admission: EM | Disposition: A | Discharge status: Home / Self Care | Payer: Self-pay | Source: Home / Self Care | Attending: Emergency Medicine

## 2018-02-14 ENCOUNTER — Observation Stay: Payer: Medicare HMO | Admitting: Anesthesiology

## 2018-02-14 DIAGNOSIS — K449 Diaphragmatic hernia without obstruction or gangrene: Secondary | ICD-10-CM | POA: Diagnosis not present

## 2018-02-14 DIAGNOSIS — K920 Hematemesis: Secondary | ICD-10-CM | POA: Diagnosis not present

## 2018-02-14 DIAGNOSIS — K2100 Gastro-esophageal reflux disease with esophagitis, without bleeding: Secondary | ICD-10-CM

## 2018-02-14 DIAGNOSIS — K2210 Ulcer of esophagus without bleeding: Principal | ICD-10-CM

## 2018-02-14 DIAGNOSIS — K922 Gastrointestinal hemorrhage, unspecified: Secondary | ICD-10-CM

## 2018-02-14 HISTORY — PX: ESOPHAGOGASTRODUODENOSCOPY (EGD) WITH PROPOFOL: SHX5813

## 2018-02-14 LAB — CBC
HCT: 38 % (ref 36.0–46.0)
HEMOGLOBIN: 12.5 g/dL (ref 12.0–15.0)
MCH: 29.5 pg (ref 26.0–34.0)
MCHC: 32.9 g/dL (ref 30.0–36.0)
MCV: 89.6 fL (ref 80.0–100.0)
Platelets: 157 10*3/uL (ref 150–400)
RBC: 4.24 MIL/uL (ref 3.87–5.11)
RDW: 15.2 % (ref 11.5–15.5)
WBC: 5.8 10*3/uL (ref 4.0–10.5)
nRBC: 0 % (ref 0.0–0.2)

## 2018-02-14 LAB — BASIC METABOLIC PANEL
Anion gap: 7 (ref 5–15)
BUN: 13 mg/dL (ref 8–23)
CO2: 26 mmol/L (ref 22–32)
Calcium: 8.8 mg/dL — ABNORMAL LOW (ref 8.9–10.3)
Chloride: 107 mmol/L (ref 98–111)
Creatinine, Ser: 0.61 mg/dL (ref 0.44–1.00)
GFR calc Af Amer: >60 mL/min (ref >60)
GFR calc non Af Amer: >60 mL/min (ref >60)
Glucose, Bld: 106 mg/dL — ABNORMAL HIGH (ref 70–99)
Potassium: 3.3 mmol/L — ABNORMAL LOW (ref 3.5–5.1)
Sodium: 140 mmol/L (ref 135–145)

## 2018-02-14 SURGERY — ESOPHAGOGASTRODUODENOSCOPY (EGD) WITH PROPOFOL
Anesthesia: General

## 2018-02-14 MED ORDER — SODIUM CHLORIDE 0.9 % IV SOLN: INTRAVENOUS | Status: DC

## 2018-02-14 MED ORDER — FLUTICASONE-SALMETEROL 250-50 MCG/DOSE IN AEPB: 1.0000 | INHALATION_SPRAY | Freq: Two times a day (BID) | RESPIRATORY_TRACT | 3 refills | Status: DC

## 2018-02-14 MED ORDER — ALBUTEROL SULFATE HFA 108 (90 BASE) MCG/ACT IN AERS: 2.0000 | INHALATION_SPRAY | Freq: Four times a day (QID) | RESPIRATORY_TRACT | 2 refills | Status: DC | PRN

## 2018-02-14 MED ORDER — PROPOFOL 500 MG/50ML IV EMUL
INTRAVENOUS | Status: AC
  Filled 2018-02-14: qty 50

## 2018-02-14 MED ORDER — PROPOFOL 10 MG/ML IV BOLUS
INTRAVENOUS | Status: DC | PRN
  Administered 2018-02-14 (×3): 33.2 mg via INTRAVENOUS

## 2018-02-14 MED ORDER — POTASSIUM CHLORIDE CRYS ER 20 MEQ PO TBCR
20.0000 meq | EXTENDED_RELEASE_TABLET | Freq: Two times a day (BID) | ORAL | Status: DC
  Administered 2018-02-14: 14:00:00 20 meq via ORAL
  Filled 2018-02-14: qty 1

## 2018-02-14 MED ORDER — OMEPRAZOLE 40 MG PO CPDR: 40.0000 mg | DELAYED_RELEASE_CAPSULE | Freq: Two times a day (BID) | ORAL | 2 refills | Status: AC

## 2018-02-14 MED ORDER — ALBUTEROL SULFATE HFA 108 (90 BASE) MCG/ACT IN AERS: 2.0000 | INHALATION_SPRAY | Freq: Four times a day (QID) | RESPIRATORY_TRACT | 2 refills | Status: AC | PRN

## 2018-02-14 MED ORDER — PROPOFOL 500 MG/50ML IV EMUL
INTRAVENOUS | Status: DC | PRN
  Administered 2018-02-14: 140 ug/kg/min via INTRAVENOUS

## 2018-02-14 MED ORDER — FLUTICASONE-SALMETEROL 250-50 MCG/DOSE IN AEPB: 1.0000 | INHALATION_SPRAY | Freq: Two times a day (BID) | RESPIRATORY_TRACT | 3 refills | Status: AC

## 2018-02-14 MED ORDER — OMEPRAZOLE 40 MG PO CPDR: 40.0000 mg | DELAYED_RELEASE_CAPSULE | Freq: Two times a day (BID) | ORAL | 2 refills | Status: DC

## 2018-02-14 MED ORDER — LIDOCAINE HCL (PF) 2 % IJ SOLN
INTRAMUSCULAR | Status: AC
  Filled 2018-02-14: qty 10

## 2018-02-14 NOTE — Discharge Summary (Signed)
[facility] - [place] at [facility]   PATIENT NAME: Loretta KaufmannRosie Freyre    MR#:  [id number]  DATE OF BIRTH:  1945-10-20  DATE OF ADMISSION:  02/12/2018   ADMITTING PHYSICIAN: Auburn BilberryShreyang Patel, MD  DATE OF DISCHARGE: 02/14/18  PRIMARY CARE PHYSICIAN: [facility]   ADMISSION DIAGNOSIS:   Hematemesis with nausea [K92.0]  DISCHARGE DIAGNOSIS:   Active Problems:   Hematemesis with nausea   Ulcer, esophagus peptic   Erosive esophagitis   Hiatal hernia with GERD and esophagitis   SECONDARY DIAGNOSIS:   Past Medical History:  Diagnosis Date  . GERD (gastroesophageal reflux disease)   . Hiatal hernia   . Multiple gastric ulcers     HOSPITAL COURSE:   72 year old female with past medical history significant for peptic ulcer disease, hiatal hernia and prior history of gastric ulcers presents to hospital secondary to melena and hematemesis.  1.  Upper GI bleed-presenting with melena and hematemesis -Prior history of gastric ulcers.  Uses NSAIDs at home -Appreciate GI consult.  Patient was started on IV Protonix here in the hospital with improvement in her symptoms. -Hemoglobin remained stable.  Did not require any transfusion. -Status post EGD this admission showing a large hiatal hernia, severe esophagitis and nonbleeding esophageal ulcer. -Patient should take PPI twice daily indefinitely.  She will be discharged on Prilosec 40 mg twice daily -Follow-up with GI in 3 weeks for repeat outpatient endoscopy to ensure healing.  2.  Hypokalemia-secondary to GI losses.  Being replaced aggressively. -Magnesium is within normal limits  3.  Acute anemia-secondary to GI losses.  Hemoglobin drop noted initially.  But has been stable around 12 -Did not require any transfusion this admission.  4.  Dry cough and wheezing- cxr with no acute findings -Recommended over-the-counter cough medicines.  Added an albuterol inhaler and Advair at  discharge.  Encourage ambulation.  Discharge home today  DISCHARGE CONDITIONS:   Guarded  CONSULTS OBTAINED:   Treatment Team:  Toney ReilVanga, Rohini Reddy, MD  DRUG ALLERGIES:   No Known Allergies DISCHARGE MEDICATIONS:   Allergies as of 02/14/2018   No Known Allergies     Medication List    STOP taking these medications   brompheniramine-pseudoephedrine-DM 30-2-10 MG/5ML syrup   cephALEXin 500 MG capsule Commonly known as:  KEFLEX   Omeprazole 20 MG Tbdd Replaced by:  omeprazole 40 MG capsule     TAKE these medications   albuterol 108 (90 Base) MCG/ACT inhaler Commonly known as:  PROVENTIL HFA;VENTOLIN HFA Inhale 2 puffs into the lungs every 6 (six) hours as needed for wheezing or shortness of breath.   Fluticasone-Salmeterol 250-50 MCG/DOSE Aepb Commonly known as:  ADVAIR Inhale 1 puff into the lungs 2 (two) times daily.   omeprazole 40 MG capsule Commonly known as:  PRILOSEC Take 1 capsule (40 mg total) by mouth 2 (two) times daily. Replaces:  Omeprazole 20 MG Tbdd   traMADol-acetaminophen 37.5-325 MG tablet Commonly known as:  ULTRACET Take 1 tablet by mouth every 6 (six) hours as needed.        DISCHARGE INSTRUCTIONS:   1. PCP f/u in 1-2 weeks 2. GI f/u in 3 weeks for repeat EGD 3. Avoid NSAIDS  DIET:   Regular diet  ACTIVITY:   Activity as tolerated  OXYGEN:   Home Oxygen: No.  Oxygen Delivery: room air  DISCHARGE LOCATION:   home   If you experience worsening of your admission symptoms, develop shortness of breath, life threatening emergency, suicidal or  homicidal thoughts you must seek medical attention immediately by calling 911 or calling your MD immediately  if symptoms less severe.  You Must read complete instructions/literature along with all the possible adverse reactions/side effects for all the Medicines you take and that have been prescribed to you. Take any new Medicines after you have completely understood and accpet all the  possible adverse reactions/side effects.   Please note  You were cared for by a hospitalist during your hospital stay. If you have any questions about your discharge medications or the care you received while you were in the hospital after you are discharged, you can call the unit and asked to speak with the hospitalist on call if the hospitalist that took care of you is not available. Once you are discharged, your primary care physician will handle any further medical issues. Please note that NO REFILLS for any discharge medications will be authorized once you are discharged, as it is imperative that you return to your primary care physician (or establish a relationship with a primary care physician if you do not have one) for your aftercare needs so that they can reassess your need for medications and monitor your lab values.    On the day of Discharge:  VITAL SIGNS:   Blood pressure (!) 158/99, pulse (!) 102, temperature (!) 97 F (36.1 C), temperature source Tympanic, resp. rate 18, height 5\' 4"  (1.626 m), weight 66.4 kg, SpO2 99 %.  PHYSICAL EXAMINATION:    GENERAL:  72 y.o.-year-old patient lying in the bed with no acute distress.  EYES: Pupils equal, round, reactive to light and accommodation. No scleral icterus. Extraocular muscles intact.  HEENT: Head atraumatic, normocephalic. Oropharynx and nasopharynx clear.  NECK:  Supple, no jugular venous distention. No thyroid enlargement, no tenderness.  LUNGS: Normal breath sounds bilaterally, no wheezing, rales,rhonchi or crepitation.   minimal expiratory wheezing all over the lung fields.  No use of accessory muscles of respiration.  CARDIOVASCULAR: S1, S2 normal. No murmurs, rubs, or gallops.  ABDOMEN: Soft, nontender, nondistended. Bowel sounds present. No organomegaly or mass.  EXTREMITIES: No pedal edema, cyanosis, or clubbing.  NEUROLOGIC: Cranial nerves II through XII are intact. Muscle strength 5/5 in all extremities. Sensation  intact. Gait not checked.  PSYCHIATRIC: The patient is alert and oriented x 3.  SKIN: No obvious rash, lesion, or ulcer. Marland Kitchen   DATA REVIEW:   CBC Recent Labs  Lab 02/14/18 0504  WBC 5.8  HGB 12.5  HCT 38.0  PLT 157    Chemistries  Recent Labs  Lab 02/12/18 0916  02/13/18 1128  02/14/18 0504  NA 144   < >  --   --  140  K 3.5   < > 3.9   < > 3.3*  CL 94*   < >  --   --  107  CO2 34*   < >  --   --  26  GLUCOSE 138*   < >  --   --  106*  BUN 37*   < >  --   --  13  CREATININE 0.87   < >  --   --  0.61  CALCIUM 10.5*   < >  --   --  8.8*  MG  --   --  1.9  --   --   AST 37  --   --   --   --   ALT 30  --   --   --   --  ALKPHOS 73  --   --   --   --   BILITOT 1.0  --   --   --   --    < > = values in this interval not displayed.     Microbiology Results  No results found for this or any previous visit.  RADIOLOGY:  No results found.   Management plans discussed with the patient, family and they are in agreement.  CODE STATUS:     Code Status Orders  (From admission, onward)         Start     Ordered   02/12/18 1359  Full code  Continuous     02/12/18 1358        Code Status History    This patient has a current code status but no historical code status.      TOTAL TIME TAKING CARE OF THIS PATIENT: 38 minutes.    Enid Baas M.D on 02/14/2018 at 11:48 AM  Between 7am to 6pm - Pager - [phone]  After 6pm go to [email or web address] - Scientist, research (life sciences)  [facility]  Office  [phone]  CC: Primary care physician; [employer]   Note: This dictation was prepared with Nurse, children's dictation along with smaller Lobbyist. Any transcriptional errors that result from this process are unintentional.

## 2018-02-14 NOTE — Anesthesia Postprocedure Evaluation (Signed)
Anesthesia Post Note  Patient: Loretta PorterRosie S Wilkins  Procedure(s) Performed: ESOPHAGOGASTRODUODENOSCOPY (EGD) WITH PROPOFOL (N/A )  Patient location during evaluation: Endoscopy Anesthesia Type: General Level of consciousness: awake and alert Pain management: pain level controlled Vital Signs Assessment: post-procedure vital signs reviewed and stable Respiratory status: spontaneous breathing and respiratory function stable Cardiovascular status: stable Anesthetic complications: no     Last Vitals:  Vitals:   02/14/18 1049 02/14/18 1138  BP: (!) 173/78 (!) 158/99  Pulse: 74 (!) 102  Resp: 18 18  Temp: 36.6 C (!) 36.1 C  SpO2: 98% 99%    Last Pain:  Vitals:   02/14/18 1138  TempSrc: Tympanic  PainSc: 0-No pain                 KEPHART,WILLIAM K

## 2018-02-14 NOTE — Anesthesia Post-op Follow-up Note (Signed)
Anesthesia QCDR form completed.        

## 2018-02-14 NOTE — Progress Notes (Signed)
Pharmacy Electrolyte Monitoring Consult:  Pharmacy consulted to assist in monitoring and replacing electrolytes in this 72 y.o. female admitted on 02/12/2018 with Hematemesis   Labs:  Sodium (mmol/L)  Date Value  02/14/2018 140  09/29/2012 140   Potassium (mmol/L)  Date Value  02/14/2018 3.3 (L)  09/29/2012 3.5   Magnesium (mg/dL)  Date Value  16/10/960412/05/2017 1.9   Calcium (mg/dL)  Date Value  54/09/811912/06/2017 8.8 (L)   Calcium, Total (mg/dL)  Date Value  14/78/295607/19/2014 9.1   Albumin (g/dL)  Date Value  21/30/865712/04/2017 4.3  09/29/2012 3.3 (L)    Assessment/Plan: Hypokalemia. Klor-Con 20 mEq po BID x 2 doses and recheck with AM labs.   Pharmacy will continue to follow.   Carola FrostNathan A Lavert Matousek, PharmD, BCPS Clinical Pharmacist 02/14/2018 7:22 AM

## 2018-02-14 NOTE — Transfer of Care (Signed)
Immediate Anesthesia Transfer of Care Note  Patient: Loretta PorterRosie S Forgette  Procedure(s) Performed: ESOPHAGOGASTRODUODENOSCOPY (EGD) WITH PROPOFOL (N/A )  Patient Location: PACU and Endoscopy Unit  Anesthesia Type:General  Level of Consciousness: sedated  Airway & Oxygen Therapy: Patient Spontanous Breathing  Post-op Assessment: Post -op Vital signs reviewed and stable  Post vital signs: stable  Last Vitals:  Vitals Value Taken Time  BP 158/99 02/14/2018 11:39 AM  Temp 36.1 C 02/14/2018 11:38 AM  Pulse 117 02/14/2018 11:43 AM  Resp 18 02/14/2018 11:43 AM  SpO2 99 % 02/14/2018 11:43 AM  Vitals shown include unvalidated device data.  Last Pain:  Vitals:   02/14/18 1138  TempSrc: Tympanic  PainSc: 0-No pain      Patients Stated Pain Goal: 0 (02/13/18 1406)  Complications: No apparent anesthesia complications

## 2018-02-14 NOTE — Progress Notes (Signed)
This note also relates to the following rows which could not be included: Pulse Rate - Cannot attach notes to unvalidated device data ECG Heart Rate - Cannot attach notes to unvalidated device data Resp - Cannot attach notes to unvalidated device data BP - Cannot attach notes to unvalidated device data SpO2 - Cannot attach notes to unvalidated device data   

## 2018-02-14 NOTE — Anesthesia Preprocedure Evaluation (Signed)
Anesthesia Evaluation  Patient identified by MRN, date of birth, ID band Patient awake    Reviewed: Allergy & Precautions, NPO status , Patient's Chart, lab work & pertinent test results  History of Anesthesia Complications Negative for: history of anesthetic complications  Airway Mallampati: III       Dental  (+) Edentulous Upper, Edentulous Lower   Pulmonary neg sleep apnea, neg COPD, Current Smoker,           Cardiovascular hypertension, (-) Past MI and (-) CHF (-) dysrhythmias (-) Valvular Problems/Murmurs     Neuro/Psych neg Seizures    GI/Hepatic Neg liver ROS, hiatal hernia, PUD, GERD  Medicated,  Endo/Other  neg diabetes  Renal/GU negative Renal ROS     Musculoskeletal   Abdominal   Peds  Hematology   Anesthesia Other Findings   Reproductive/Obstetrics                             Anesthesia Physical Anesthesia Plan  ASA: III  Anesthesia Plan: General   Post-op Pain Management:    Induction: Intravenous  PONV Risk Score and Plan: 2 and TIVA and Propofol infusion  Airway Management Planned: Nasal Cannula  Additional Equipment:   Intra-op Plan:   Post-operative Plan:   Informed Consent: I have reviewed the patients History and Physical, chart, labs and discussed the procedure including the risks, benefits and alternatives for the proposed anesthesia with the patient or authorized representative who has indicated his/her understanding and acceptance.     Plan Discussed with:   Anesthesia Plan Comments:         Anesthesia Quick Evaluation

## 2018-02-15 ENCOUNTER — Encounter: Payer: Self-pay | Admitting: Gastroenterology

## 2018-02-15 NOTE — Op Note (Signed)
[facility] Gastroenterology Patient Name: Loretta Wilkins Procedure Date: 02/14/2018 11:15 AM MRN: [id number] Account #: [id number] Date of Birth: 05/29/1945 Admit Type: Inpatient Age: 72 Room: [facility] ENDO ROOM 4 Gender: Female Note Status: Finalized Procedure:            Upper GI endoscopy Indications:          Coffee-ground emesis Providers:            Lin Landsman MD, MD Referring MD:         No local MD Medicines:            Monitored Anesthesia Care Complications:        No immediate complications. Estimated blood loss: None. Procedure:            Pre-Anesthesia Assessment:                       - Prior to the procedure, a History and Physical was                        performed, and patient medications and allergies were                        reviewed. The patient is competent. The risks and                        benefits of the procedure and the sedation options and                        risks were discussed with the patient. All questions                        were answered and informed consent was obtained.                        Patient identification and proposed procedure were                        verified by the physician, the nurse, the                        anesthesiologist, the anesthetist and the technician in                        the pre-procedure area in the procedure room in the                        endoscopy suite. Mental Status Examination: alert and                        oriented. Airway Examination: normal oropharyngeal                        airway and neck mobility. Respiratory Examination:                        clear to auscultation. CV Examination: normal.                        Prophylactic Antibiotics: The patient does not require  prophylactic antibiotics. Prior Anticoagulants: The                        patient has taken no previous anticoagulant or                        antiplatelet agents.  ASA Grade Assessment: III - A                        patient with severe systemic disease. After reviewing                        the risks and benefits, the patient was deemed in                        satisfactory condition to undergo the procedure. The                        anesthesia plan was to use monitored anesthesia care                        (MAC). Immediately prior to administration of                        medications, the patient was re-assessed for adequacy                        to receive sedatives. The heart rate, respiratory rate,                        oxygen saturations, blood pressure, adequacy of                        pulmonary ventilation, and response to care were                        monitored throughout the procedure. The physical status                        of the patient was re-assessed after the procedure.                       After obtaining informed consent, the endoscope was                        passed under direct vision. Throughout the procedure,                        the patient's blood pressure, pulse, and oxygen                        saturations were monitored continuously. The Endoscope                        was introduced through the mouth, and advanced to the                        second part of duodenum. The upper GI endoscopy was  accomplished without difficulty. The patient tolerated                        the procedure well. Findings:      The duodenal bulb and second portion of the duodenum were normal.      A large hiatal hernia was found. The proximal extent of the gastric       folds (end of tubular esophagus) was 28 cm from the incisors. The hiatal       narrowing was 38 cm from the incisors. The Z-line was a variable       distance from incisors; the hiatal hernia was sliding.      One superficial esophageal ulcer with no bleeding and no stigmata of       recent bleeding was found in the distal esophagus.  The lesion was 10 mm       in largest dimension.      LA Grade D (one or more mucosal breaks involving at least 75% of       esophageal circumference) esophagitis with no bleeding was found in the       lower third of the esophagus. Impression:           - Normal duodenal bulb and second portion of the                        duodenum.                       - Large hiatal hernia.                       - Non-bleeding esophageal ulcer.                       - LA Grade D reflux and erosive esophagitis.                       - No specimens collected. Recommendation:       - Return patient to hospital ward for possible                        discharge same day.                       - Resume previous diet today.                       - Continue present medications.                       - Use Prilosec (omeprazole) 40 mg PO BID indefinitely.                       - Follow an antireflux regimen.                       - Repeat upper endoscopy in 3 months to check healing,                        to evaluate the response to therapy and for screening                        purposes.                       -  Return to GI office in 1 month. Procedure Code(s):    --- Professional ---                       [phone], Esophagogastroduodenoscopy, flexible, transoral;                        diagnostic, including collection of specimen(s) by                        brushing or washing, when performed (separate procedure) Diagnosis Code(s):    --- Professional ---                       K44.9, Diaphragmatic hernia without obstruction or                        gangrene                       K22.10, Ulcer of esophagus without bleeding                       K21.0, Gastro-esophageal reflux disease with esophagitis                       K20.8, Other esophagitis                       K92.0, Hematemesis CPT copyright 2018 American [employer] Association. All rights reserved. The codes documented in this report are  preliminary and upon coder review may  be revised to meet current compliance requirements. Dr. Ulyess Mort Lin Landsman MD, MD 02/14/2018 11:38:57 AM This report has been signed electronically. Number of Addenda: 0 Note Initiated On: 02/14/2018 11:15 AM      [facility]

## 2018-03-26 ENCOUNTER — Ambulatory Visit: Payer: Medicare HMO | Admitting: Gastroenterology

## 2018-03-26 ENCOUNTER — Encounter: Payer: Self-pay | Admitting: Gastroenterology

## 2018-03-26 DIAGNOSIS — K920 Hematemesis: Secondary | ICD-10-CM

## 2018-08-08 IMAGING — CR DG LUMBAR SPINE 2-3V
3 series · 3 of 3 positions shown · non-contrast
Comparison: None in PACs

CLINICAL DATA: Patient reports falling 11 days ago with persistent
low back pain radiating anteriorly to the abdomen. Also left knee
and leg pain.

EXAM:
LUMBAR SPINE - 2-3 VIEW

[l-spine ap]
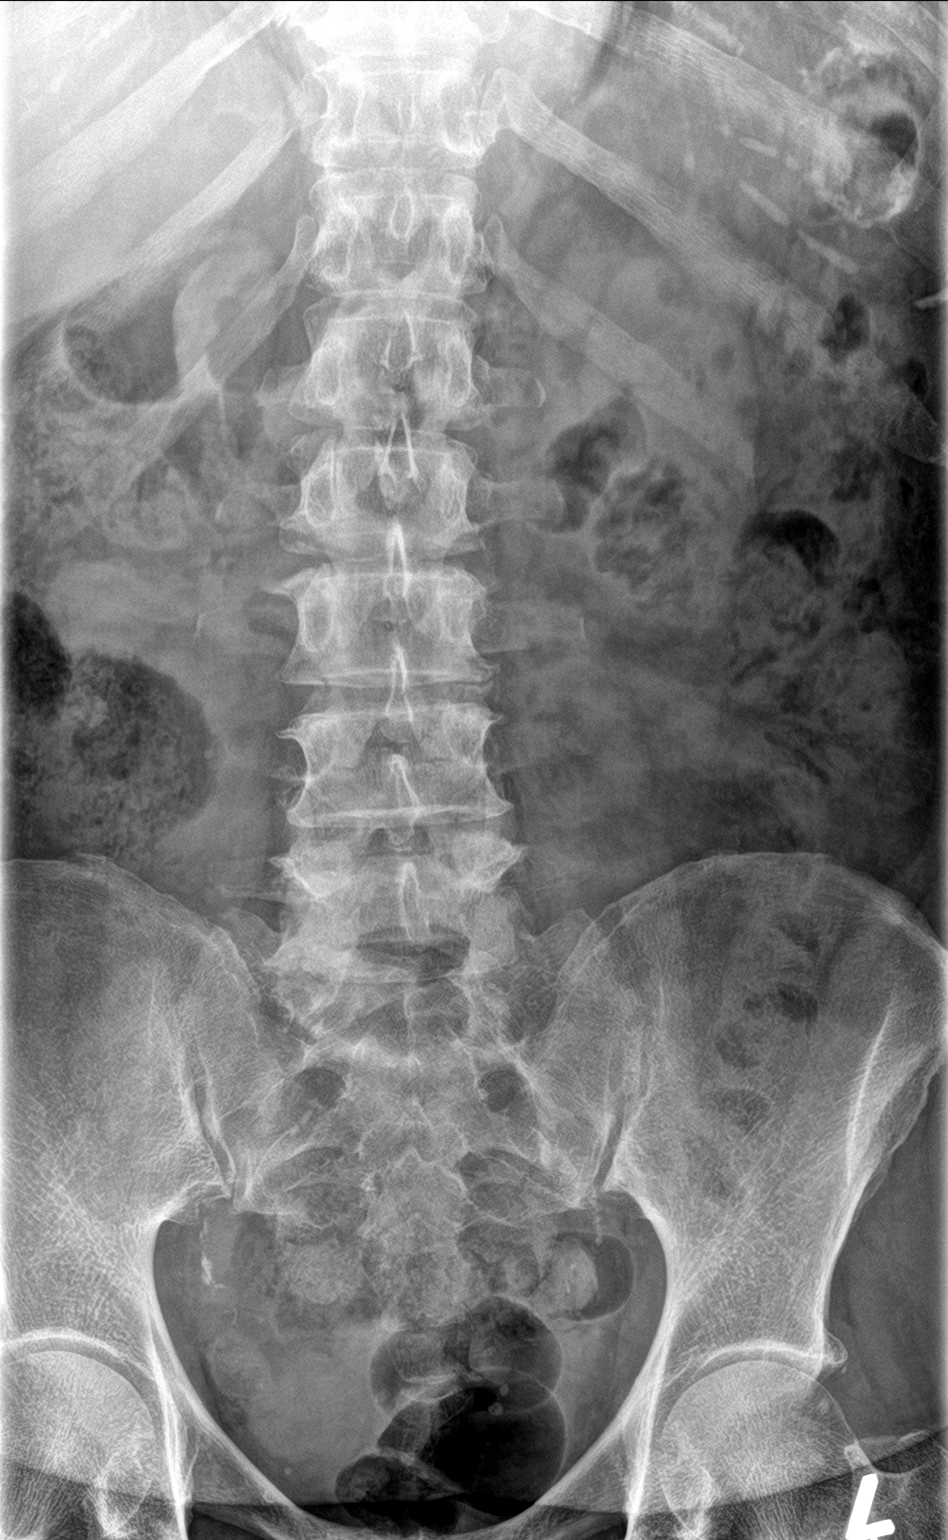

[l-spine lat]
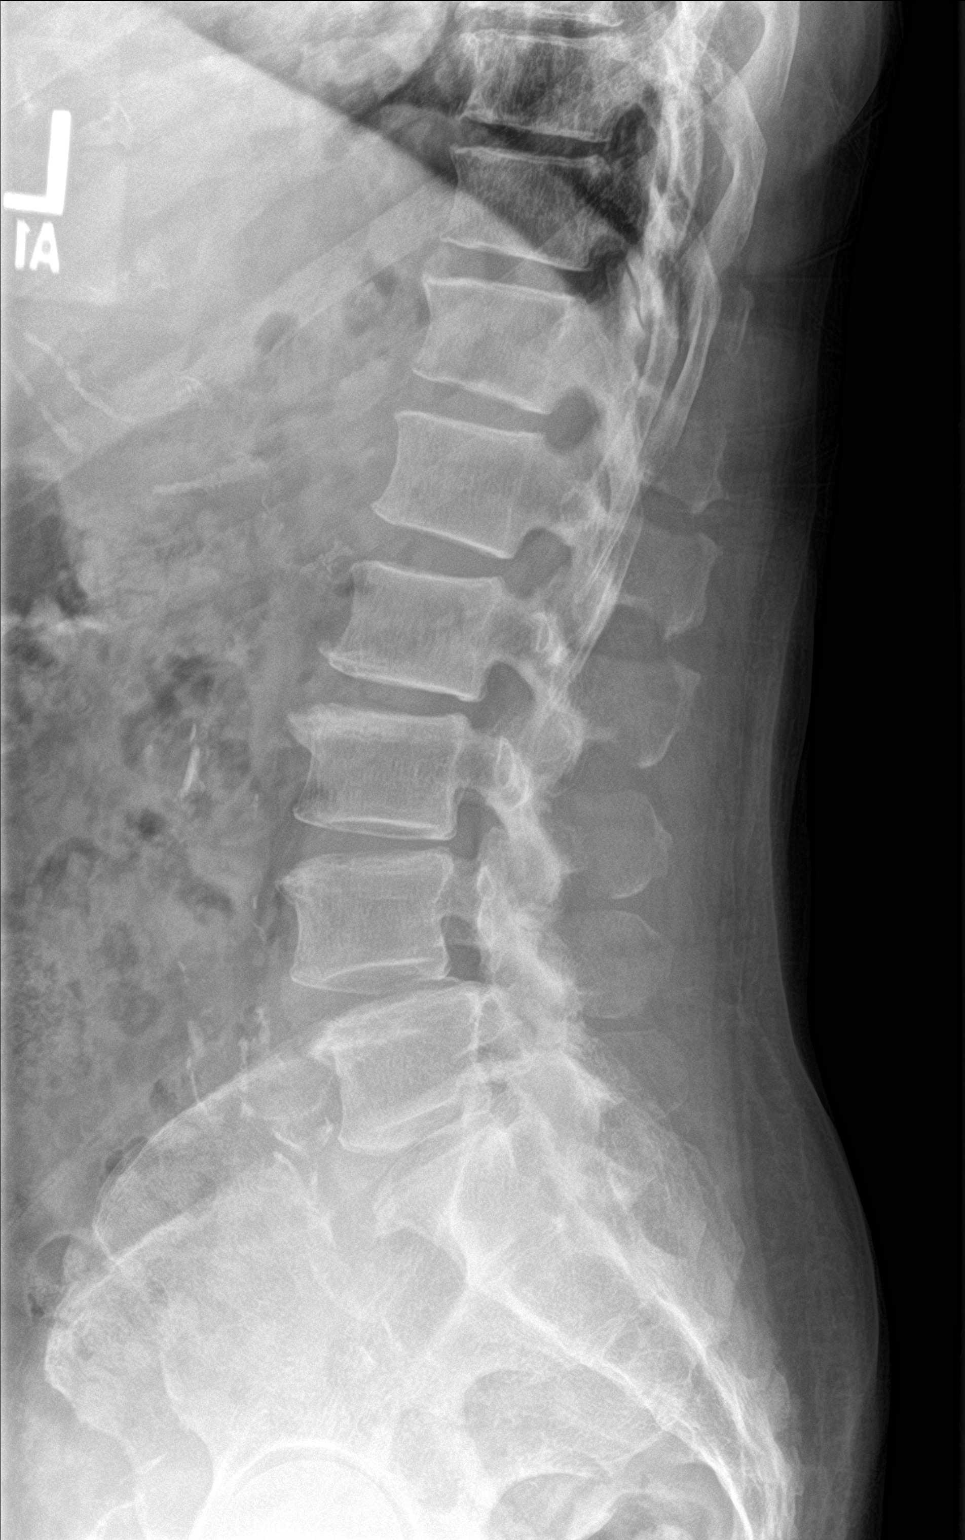

[l-spine spot]
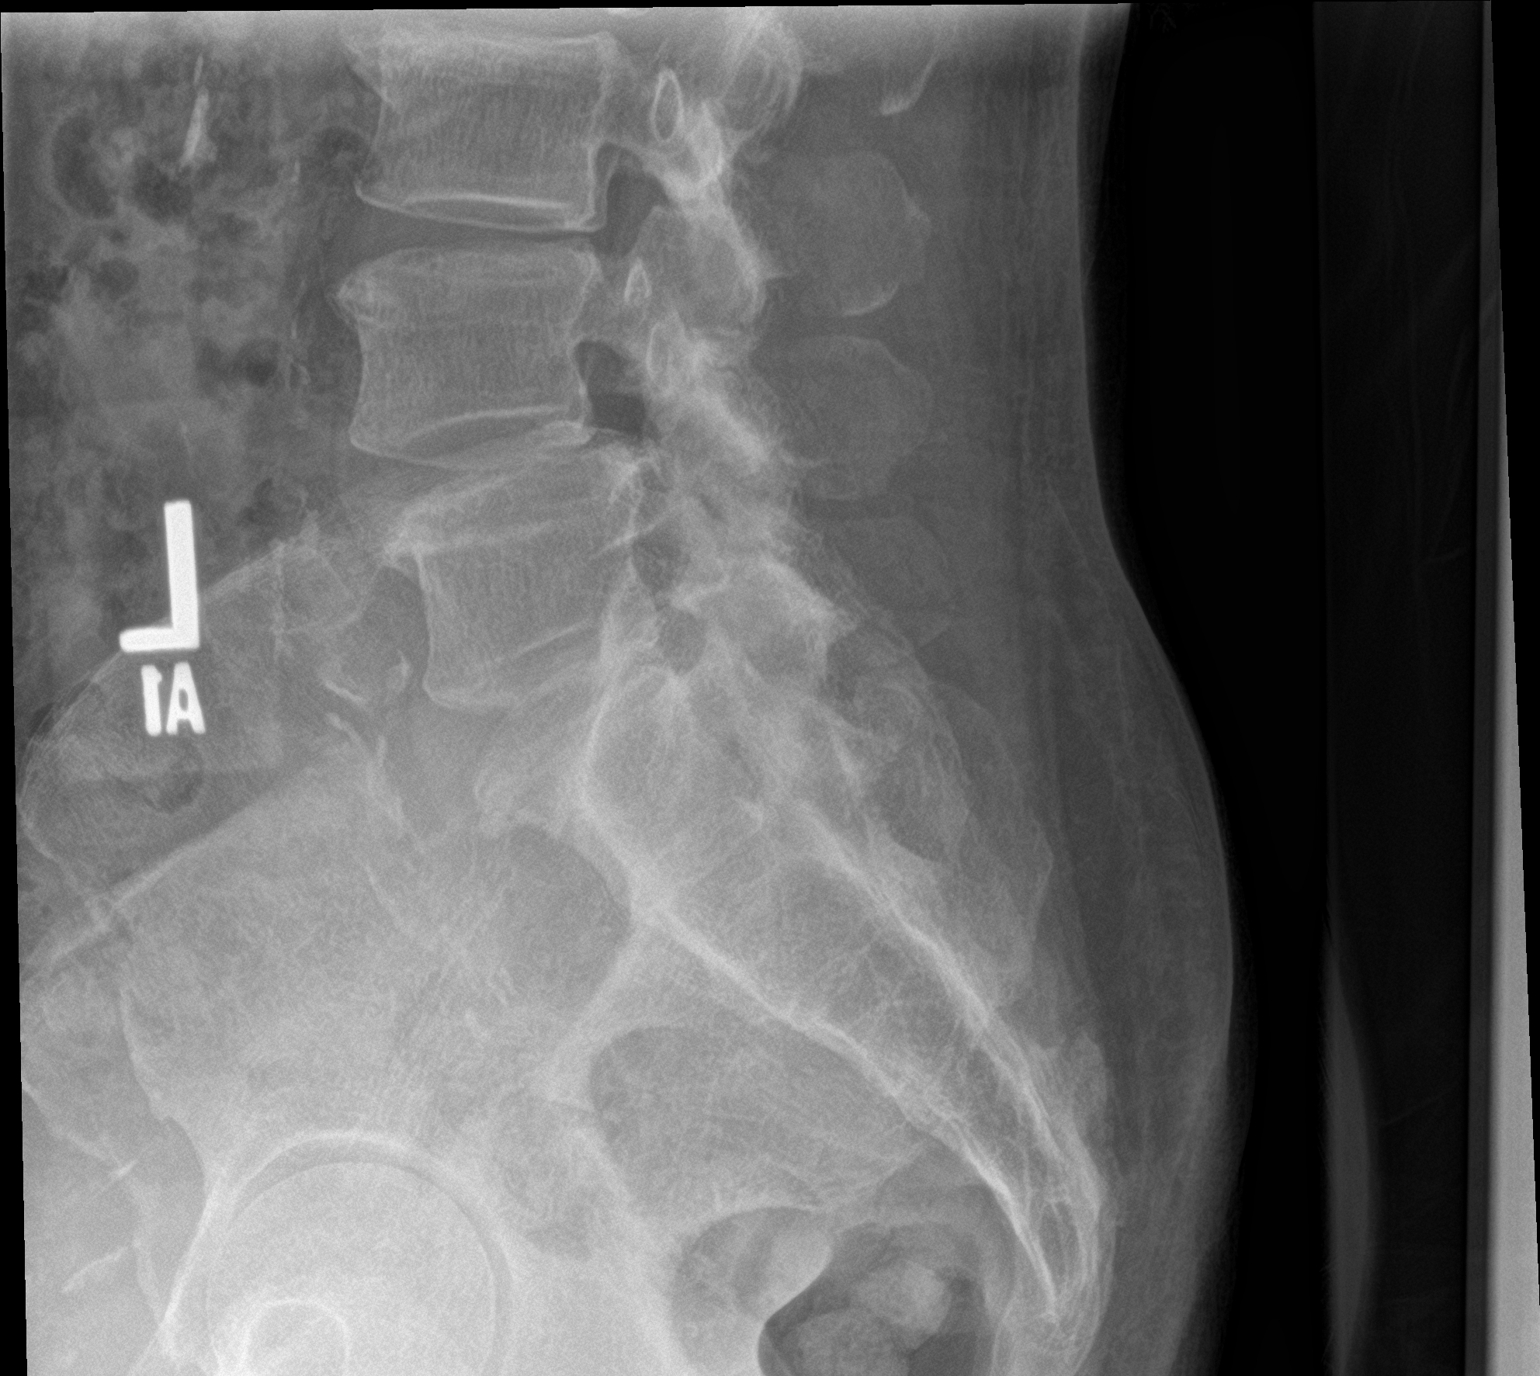

[3 of 3 positions shown; findings below may reference images not displayed]

FINDINGS: The lumbar vertebral bodies are preserved in height. The pedicles
and transverse processes are intact. There is grade 1
anterolisthesis of L4 with respect L5. There is mild disc space
narrowing at L2-3, L3-4, and L4-5. There is facet joint hypertrophy
at L3-4, L4-5, and L5-S1. There small anterior endplate osteophytes
from L2 through L5. There is calcification in the wall of the
abdominal aorta and common iliac vessels. The observed portions of
the sacrum are normal.
IMPRESSION: There is no acute compression fracture. There is grade 1
anterolisthesis of L4 with respect L5 likely on the basis of
degenerative disc and facet joint change. There is mild to moderate
degenerative disc space narrowing from L2-3 through L4-5 with facet
joint degenerative change from L3 through S1.

## 2019-12-17 ENCOUNTER — Encounter: Payer: Self-pay | Admitting: Family Medicine

## 2020-02-24 ENCOUNTER — Ambulatory Visit: Payer: Medicare HMO | Admitting: Gastroenterology

## 2021-08-30 ENCOUNTER — Encounter: Payer: Self-pay | Admitting: Ophthalmology

## 2021-08-31 NOTE — Discharge Instructions (Signed)

## 2021-09-02 ENCOUNTER — Ambulatory Visit
Admission: RE | Admit: 2021-09-02 | Discharge: 2021-09-02 | Disposition: A | Discharge status: Home / Self Care | Payer: Medicare HMO | Attending: Ophthalmology | Admitting: Ophthalmology

## 2021-09-02 ENCOUNTER — Encounter: Payer: Self-pay | Admitting: Ophthalmology

## 2021-09-02 ENCOUNTER — Encounter
Admission: RE | Disposition: A | Discharge status: Home / Self Care | Payer: Self-pay | Source: Home / Self Care | Attending: Ophthalmology

## 2021-09-02 ENCOUNTER — Ambulatory Visit: Payer: Medicare HMO | Admitting: Anesthesiology

## 2021-09-02 ENCOUNTER — Other Ambulatory Visit: Payer: Self-pay

## 2021-09-02 DIAGNOSIS — K219 Gastro-esophageal reflux disease without esophagitis: Secondary | ICD-10-CM | POA: Diagnosis not present

## 2021-09-02 DIAGNOSIS — F17210 Nicotine dependence, cigarettes, uncomplicated: Secondary | ICD-10-CM | POA: Diagnosis not present

## 2021-09-02 DIAGNOSIS — I10 Essential (primary) hypertension: Secondary | ICD-10-CM | POA: Insufficient documentation

## 2021-09-02 DIAGNOSIS — H2511 Age-related nuclear cataract, right eye: Secondary | ICD-10-CM | POA: Diagnosis present

## 2021-09-02 HISTORY — DX: Presence of dental prosthetic device (complete) (partial): Z97.2

## 2021-09-02 HISTORY — PX: CATARACT EXTRACTION W/PHACO: SHX586

## 2021-09-02 HISTORY — DX: Essential (primary) hypertension: I10

## 2021-09-02 SURGERY — PHACOEMULSIFICATION, CATARACT, WITH IOL INSERTION
Anesthesia: Monitor Anesthesia Care | Site: Eye | Laterality: Right

## 2021-09-02 MED ORDER — TETRACAINE HCL 0.5 % OP SOLN
1.0000 [drp] | OPHTHALMIC | Status: DC | PRN
  Administered 2021-09-02 (×3): 1 [drp] via OPHTHALMIC

## 2021-09-02 MED ORDER — TETRACAINE 0.5 % OP SOLN OPTIME - NO CHARGE
OPHTHALMIC | Status: DC | PRN
  Administered 2021-09-02: 1 [drp] via OPHTHALMIC

## 2021-09-02 MED ORDER — ACETAMINOPHEN 325 MG PO TABS: 325.0000 mg | ORAL_TABLET | ORAL | Status: DC | PRN

## 2021-09-02 MED ORDER — SIGHTPATH DOSE#1 NA HYALUR & NA CHOND-NA HYALUR IO KIT
PACK | INTRAOCULAR | Status: DC | PRN
  Administered 2021-09-02: 1 via OPHTHALMIC

## 2021-09-02 MED ORDER — ONDANSETRON HCL 4 MG/2ML IJ SOLN: 4.0000 mg | Freq: Once | INTRAMUSCULAR | Status: DC | PRN

## 2021-09-02 MED ORDER — ACETAMINOPHEN 160 MG/5ML PO SOLN: 325.0000 mg | ORAL | Status: DC | PRN

## 2021-09-02 MED ORDER — SIGHTPATH DOSE#1 BSS IO SOLN
INTRAOCULAR | Status: DC | PRN
  Administered 2021-09-02: 15 mL

## 2021-09-02 MED ORDER — FENTANYL CITRATE (PF) 100 MCG/2ML IJ SOLN
INTRAMUSCULAR | Status: DC | PRN
  Administered 2021-09-02: 50 ug via INTRAVENOUS

## 2021-09-02 MED ORDER — BRIMONIDINE TARTRATE-TIMOLOL 0.2-0.5 % OP SOLN
OPHTHALMIC | Status: DC | PRN
  Administered 2021-09-02: 1 [drp] via OPHTHALMIC

## 2021-09-02 MED ORDER — MOXIFLOXACIN HCL 0.5 % OP SOLN
OPHTHALMIC | Status: DC | PRN
  Administered 2021-09-02: 0.2 mL via OPHTHALMIC

## 2021-09-02 MED ORDER — MIDAZOLAM HCL 2 MG/2ML IJ SOLN
INTRAMUSCULAR | Status: DC | PRN
  Administered 2021-09-02: 1 mg via INTRAVENOUS

## 2021-09-02 MED ORDER — ARMC OPHTHALMIC DILATING DROPS: OPHTHALMIC | Status: DC | PRN

## 2021-09-02 MED ORDER — LIDOCAINE HCL (PF) 2 % IJ SOLN
INTRAOCULAR | Status: DC | PRN
  Administered 2021-09-02: 1 mL via INTRAOCULAR

## 2021-09-02 MED ORDER — SIGHTPATH DOSE#1 BSS IO SOLN
INTRAOCULAR | Status: DC | PRN
  Administered 2021-09-02: 84 mL via OPHTHALMIC

## 2021-09-02 MED ORDER — TRYPAN BLUE 0.06 % IO SOSY
PREFILLED_SYRINGE | INTRAOCULAR | Status: DC | PRN
  Administered 2021-09-02: .2 mL via INTRAOCULAR

## 2021-09-02 SURGICAL SUPPLY — 17 items
CANNULA ANT/CHMB 27G (MISCELLANEOUS) IMPLANT
CANNULA ANT/CHMB 27GA (MISCELLANEOUS) ×2 IMPLANT
CATARACT SUITE SIGHTPATH (MISCELLANEOUS) ×2 IMPLANT
DISSECTOR HYDRO NUCLEUS 50X22 (MISCELLANEOUS) ×2 IMPLANT
DRSG TEGADERM 2-3/8X2-3/4 SM (GAUZE/BANDAGES/DRESSINGS) ×2 IMPLANT
FEE CATARACT SUITE SIGHTPATH (MISCELLANEOUS) ×1 IMPLANT
GLOVE SURG GAMMEX PI TX LF 7.5 (GLOVE) ×2 IMPLANT
GLOVE SURG SYN 8.5  E (GLOVE) ×1
GLOVE SURG SYN 8.5 E (GLOVE) ×1 IMPLANT
GLOVE SURG SYN 8.5 PF PI (GLOVE) ×1 IMPLANT
LENS IOL TECNIS EYHANCE 21.0 (Intraocular Lens) ×1 IMPLANT
NDL FILTER BLUNT 18X1 1/2 (NEEDLE) ×1 IMPLANT
NEEDLE FILTER BLUNT 18X 1/2SAF (NEEDLE) ×1
NEEDLE FILTER BLUNT 18X1 1/2 (NEEDLE) ×1 IMPLANT
SYR 3ML LL SCALE MARK (SYRINGE) ×2 IMPLANT
SYR 5ML LL (SYRINGE) ×2 IMPLANT
WATER STERILE IRR 250ML POUR (IV SOLUTION) ×2 IMPLANT

## 2021-09-02 NOTE — Transfer of Care (Signed)
Immediate Anesthesia Transfer of Care Note  Patient: Loretta Wilkins  Procedure(s) Performed: CATARACT EXTRACTION PHACO AND INTRAOCULAR LENS PLACEMENT (IOC)RIGHT 23.70 01:53.0 (Right: Eye)  Patient Location: PACU  Anesthesia Type: MAC  Level of Consciousness: awake, alert  and patient cooperative  Airway and Oxygen Therapy: Patient Spontanous Breathing and Patient connected to supplemental oxygen  Post-op Assessment: Post-op Vital signs reviewed, Patient's Cardiovascular Status Stable, Respiratory Function Stable, Patent Airway and No signs of Nausea or vomiting  Post-op Vital Signs: Reviewed and stable  Complications: No notable events documented.

## 2021-09-02 NOTE — Anesthesia Preprocedure Evaluation (Signed)
Anesthesia Evaluation  Patient identified by MRN, date of birth, ID band Patient awake    Reviewed: Allergy & Precautions, NPO status   Airway Mallampati: II  TM Distance: >3 FB     Dental  (+) Lower Dentures, Upper Dentures   Pulmonary Current SmokerPatient did not abstain from smoking.,    Pulmonary exam normal        Cardiovascular hypertension,  Rhythm:Regular Rate:Normal     Neuro/Psych    GI/Hepatic hiatal hernia, PUD, GERD  Medicated,  Endo/Other    Renal/GU      Musculoskeletal   Abdominal   Peds  Hematology   Anesthesia Other Findings   Reproductive/Obstetrics                             Anesthesia Physical Anesthesia Plan  ASA: 2  Anesthesia Plan: MAC   Post-op Pain Management: Minimal or no pain anticipated   Induction: Intravenous  PONV Risk Score and Plan: 1 and TIVA, Midazolam and Treatment may vary due to age or medical condition  Airway Management Planned: Natural Airway and Nasal Cannula  Additional Equipment:   Intra-op Plan:   Post-operative Plan:   Informed Consent: I have reviewed the patients History and Physical, chart, labs and discussed the procedure including the risks, benefits and alternatives for the proposed anesthesia with the patient or authorized representative who has indicated his/her understanding and acceptance.       Plan Discussed with: CRNA  Anesthesia Plan Comments:         Anesthesia Quick Evaluation

## 2021-09-02 NOTE — H&P (Signed)
[facility]   Primary Care Physician:  Inc, [facility] Ophthalmologist: Dr. Deberah Pelton  Pre-Procedure History & Physical: HPI:  Loretta Wilkins is a 76 y.o. female here for cataract surgery.   Past Medical History:  Diagnosis Date   GERD (gastroesophageal reflux disease)    Hiatal hernia    Hypertension    Multiple gastric ulcers    Wears dentures    full upper and lower    Past Surgical History:  Procedure Laterality Date   ESOPHAGOGASTRODUODENOSCOPY (EGD) WITH PROPOFOL N/A 02/14/2018   Procedure: ESOPHAGOGASTRODUODENOSCOPY (EGD) WITH PROPOFOL;  Surgeon: Toney Reil, MD;  Location: [facility] ENDOSCOPY;  Service: Gastroenterology;  Laterality: N/A;    Prior to Admission medications   Medication Sig Start Date End Date Taking? Authorizing Provider  albuterol (PROVENTIL HFA;VENTOLIN HFA) 108 (90 Base) MCG/ACT inhaler Inhale 2 puffs into the lungs every 6 (six) hours as needed for wheezing or shortness of breath. 02/14/18  Yes Enid Baas, MD  amLODipine (NORVASC) 2.5 MG tablet Take 2.5 mg by mouth daily.   Yes [provider]  atorvastatin (LIPITOR) 20 MG tablet Take 20 mg by mouth daily.   Yes [provider]  fluticasone (FLONASE) 50 MCG/ACT nasal spray Place into both nostrils daily as needed for allergies or rhinitis.   Yes [provider]  Fluticasone-Salmeterol (ADVAIR DISKUS) 250-50 MCG/DOSE AEPB Inhale 1 puff into the lungs 2 (two) times daily. 02/14/18 08/30/21 Yes Enid Baas, MD  hydrochlorothiazide (HYDRODIURIL) 25 MG tablet Take 25 mg by mouth daily.   Yes [provider]  omeprazole (PRILOSEC) 40 MG capsule Take 1 capsule (40 mg total) by mouth 2 (two) times daily. 02/14/18 09/02/21 Yes Enid Baas, MD    Allergies as of 06/23/2021   (No Known Allergies)    Family History  Problem Relation Age of Onset   Hypertension Mother     Social History   Socioeconomic History   Marital  status: Single    Spouse name: Not on file   Number of children: Not on file   Years of education: Not on file   Highest education level: Not on file  Occupational History   Not on file  Tobacco Use   Smoking status: Every Day    Packs/day: 1.00    Years: 61.00    Total pack years: 61.00    Types: Cigarettes   Smokeless tobacco: Never   Tobacco comments:    Started smoking around age 60  Vaping Use   Vaping Use: Never used  Substance and Sexual Activity   Alcohol use: No   Drug use: No   Sexual activity: Not on file  Other Topics Concern   Not on file  Social History Narrative   Not on file   Social Determinants of Health   Financial Resource Strain: Not on file  Food Insecurity: Not on file  Transportation Needs: Not on file  Physical Activity: Not on file  Stress: Not on file  Social Connections: Not on file  Intimate Partner Violence: Not on file    Review of Systems: See HPI, otherwise negative ROS  Physical Exam: BP (!) 163/96   Pulse 94   Temp (!) 97.2 F (36.2 C) (Temporal)   Ht 5\' 5"  (1.651 m)   Wt 74.1 kg   SpO2 97%   BMI 27.19 kg/m  General:   Alert, cooperative in NAD Head:  Normocephalic and atraumatic. Respiratory:  Normal work of breathing. Cardiovascular:  RRR  Impression/Plan:  Loretta Wilkins is here for cataract surgery.  Risks, benefits, limitations, and alternatives regarding cataract surgery have been reviewed with the patient.  Questions have been answered.  All parties agreeable.   Estanislado Pandy, MD  09/02/2021, 12:47 PM

## 2021-09-02 NOTE — Anesthesia Postprocedure Evaluation (Signed)
Anesthesia Post Note  Patient: Loretta Wilkins  Procedure(s) Performed: CATARACT EXTRACTION PHACO AND INTRAOCULAR LENS PLACEMENT (IOC)RIGHT 23.70 01:53.0 (Right: Eye)     Patient location during evaluation: PACU Anesthesia Type: MAC Level of consciousness: awake Pain management: pain level controlled Vital Signs Assessment: post-procedure vital signs reviewed and stable Respiratory status: respiratory function stable Cardiovascular status: stable Postop Assessment: no apparent nausea or vomiting Anesthetic complications: no   No notable events documented.  Veda Canning

## 2021-09-03 ENCOUNTER — Other Ambulatory Visit: Payer: Self-pay

## 2021-09-03 ENCOUNTER — Encounter: Payer: Self-pay | Admitting: Ophthalmology

## 2021-09-21 NOTE — Discharge Instructions (Signed)

## 2021-09-23 ENCOUNTER — Encounter: Payer: Self-pay | Admitting: Ophthalmology

## 2021-09-23 ENCOUNTER — Other Ambulatory Visit: Payer: Self-pay

## 2021-09-23 ENCOUNTER — Encounter
Admission: RE | Disposition: A | Discharge status: Home / Self Care | Payer: Self-pay | Source: Ambulatory Visit | Attending: Ophthalmology

## 2021-09-23 ENCOUNTER — Ambulatory Visit: Payer: Medicare HMO | Admitting: Anesthesiology

## 2021-09-23 ENCOUNTER — Ambulatory Visit
Admission: RE | Admit: 2021-09-23 | Discharge: 2021-09-23 | Disposition: A | Discharge status: Home / Self Care | Payer: Medicare HMO | Source: Ambulatory Visit | Attending: Ophthalmology | Admitting: Ophthalmology

## 2021-09-23 DIAGNOSIS — H2512 Age-related nuclear cataract, left eye: Secondary | ICD-10-CM | POA: Insufficient documentation

## 2021-09-23 DIAGNOSIS — F17210 Nicotine dependence, cigarettes, uncomplicated: Secondary | ICD-10-CM | POA: Insufficient documentation

## 2021-09-23 DIAGNOSIS — I10 Essential (primary) hypertension: Secondary | ICD-10-CM | POA: Insufficient documentation

## 2021-09-23 DIAGNOSIS — K219 Gastro-esophageal reflux disease without esophagitis: Secondary | ICD-10-CM | POA: Diagnosis not present

## 2021-09-23 DIAGNOSIS — K2100 Gastro-esophageal reflux disease with esophagitis, without bleeding: Secondary | ICD-10-CM

## 2021-09-23 HISTORY — PX: CATARACT EXTRACTION W/PHACO: SHX586

## 2021-09-23 SURGERY — PHACOEMULSIFICATION, CATARACT, WITH IOL INSERTION
Anesthesia: Monitor Anesthesia Care | Site: Eye | Laterality: Left

## 2021-09-23 MED ORDER — MIDAZOLAM HCL 2 MG/2ML IJ SOLN
INTRAMUSCULAR | Status: DC | PRN
  Administered 2021-09-23 (×2): 1 mg via INTRAVENOUS

## 2021-09-23 MED ORDER — MOXIFLOXACIN HCL 0.5 % OP SOLN
OPHTHALMIC | Status: DC | PRN
  Administered 2021-09-23: 0.2 mL via OPHTHALMIC

## 2021-09-23 MED ORDER — SIGHTPATH DOSE#1 BSS IO SOLN
INTRAOCULAR | Status: DC | PRN
  Administered 2021-09-23: 73 mL via OPHTHALMIC

## 2021-09-23 MED ORDER — SIGHTPATH DOSE#1 NA HYALUR & NA CHOND-NA HYALUR IO KIT
PACK | INTRAOCULAR | Status: DC | PRN
  Administered 2021-09-23: 1 via OPHTHALMIC

## 2021-09-23 MED ORDER — FENTANYL CITRATE (PF) 100 MCG/2ML IJ SOLN
INTRAMUSCULAR | Status: DC | PRN
  Administered 2021-09-23: 50 ug via INTRAVENOUS

## 2021-09-23 MED ORDER — SIGHTPATH DOSE#1 BSS IO SOLN
INTRAOCULAR | Status: DC | PRN
  Administered 2021-09-23: 15 mL

## 2021-09-23 MED ORDER — TETRACAINE HCL 0.5 % OP SOLN
1.0000 [drp] | OPHTHALMIC | Status: DC | PRN
  Administered 2021-09-23 (×3): 1 [drp] via OPHTHALMIC

## 2021-09-23 MED ORDER — ARMC OPHTHALMIC DILATING DROPS: OPHTHALMIC | Status: DC | PRN

## 2021-09-23 MED ORDER — LIDOCAINE HCL (PF) 2 % IJ SOLN
INTRAOCULAR | Status: DC | PRN
  Administered 2021-09-23: 1 mL via INTRAOCULAR

## 2021-09-23 MED ORDER — BRIMONIDINE TARTRATE-TIMOLOL 0.2-0.5 % OP SOLN
OPHTHALMIC | Status: DC | PRN
  Administered 2021-09-23: 1 [drp] via OPHTHALMIC

## 2021-09-23 SURGICAL SUPPLY — 12 items
CATARACT SUITE SIGHTPATH (MISCELLANEOUS) ×2 IMPLANT
DISSECTOR HYDRO NUCLEUS 50X22 (MISCELLANEOUS) ×2 IMPLANT
DRSG TEGADERM 2-3/8X2-3/4 SM (GAUZE/BANDAGES/DRESSINGS) ×2 IMPLANT
FEE CATARACT SUITE SIGHTPATH (MISCELLANEOUS) ×1 IMPLANT
GLOVE SURG SYN 7.5  E (GLOVE) ×1
GLOVE SURG SYN 7.5 E (GLOVE) ×1 IMPLANT
GLOVE SURG SYN 7.5 PF PI (GLOVE) ×1 IMPLANT
GLOVE SURG SYN 8.5  E (GLOVE) ×1
GLOVE SURG SYN 8.5 E (GLOVE) ×1 IMPLANT
GLOVE SURG SYN 8.5 PF PI (GLOVE) ×1 IMPLANT
LENS IOL TECNIS EYHANCE 22.0 (Intraocular Lens) ×1 IMPLANT
WATER STERILE IRR 250ML POUR (IV SOLUTION) ×2 IMPLANT

## 2021-09-23 NOTE — Op Note (Signed)
OPERATIVE NOTE  Loretta Wilkins [id number] 09/23/2021   PREOPERATIVE DIAGNOSIS: Nuclear sclerotic cataract left eye. H25.12   POSTOPERATIVE DIAGNOSIS: Nuclear sclerotic cataract left eye. H25.12   PROCEDURE:  Phacoemusification with posterior chamber intraocular lens placement of the left eye  Ultrasound time: Procedure(s) with comments: CATARACT EXTRACTION PHACO AND INTRAOCULAR LENS PLACEMENT (IOC) LEFT (Left) - 5.70 00;41.3  LENS:   Implant Name Type Inv. Item Serial No. Manufacturer Lot No. LRB No. Used Action  LENS IOL TECNIS EYHANCE 22.0 - [id number] Intraocular Lens LENS IOL TECNIS EYHANCE 22.0 [id number] [facility]  Left 1 Implanted      SURGEON:  Julious Payer. Rolley Sims, MD   ANESTHESIA:  Topical with tetracaine drops, augmented with 1% preservative-free intracameral lidocaine.   COMPLICATIONS:  None.   DESCRIPTION OF PROCEDURE:  The patient was identified in the holding room and transported to the operating room and placed in the supine position under the operating microscope.  The left eye was identified as the operative eye, which was prepped and draped in the usual sterile ophthalmic fashion.   A 1 millimeter clear-corneal paracentesis was made inferotemporally. Preservative-free 1% lidocaine mixed with 1:1,000 bisulfite-free aqueous solution of epinephrine was injected into the anterior chamber. The anterior chamber was then filled with Viscoat viscoelastic. A 2.4 millimeter keratome was used to make a clear-corneal incision superotemporally. A curvilinear capsulorrhexis was made with a cystotome and capsulorrhexis forceps. Balanced salt solution was used to hydrodissect and hydrodelineate the nucleus. Phacoemulsification was then used to remove the lens nucleus and epinucleus. The remaining cortex was then removed using the irrigation and aspiration handpiece. Provisc was then placed into the capsular bag to distend it for lens placement. A +22.00 D DIB00 intraocular lens was then  injected into the capsular bag. The remaining viscoelastic was aspirated.   Wounds were hydrated with balanced salt solution.  The anterior chamber was inflated to a physiologic pressure with balanced salt solution.  No wound leaks were noted. Vigamox was injected intracamerally.  Timolol and Brimonidine drops were applied to the eye.  The patient was taken to the recovery room in stable condition without complications of anesthesia or surgery.  [name] 09/23/2021, 1:31 PM

## 2021-09-23 NOTE — Anesthesia Postprocedure Evaluation (Signed)
Anesthesia Post Note  Patient: Loretta Wilkins  Procedure(s) Performed: CATARACT EXTRACTION PHACO AND INTRAOCULAR LENS PLACEMENT (IOC) LEFT (Left: Eye)     Patient location during evaluation: PACU Anesthesia Type: MAC Level of consciousness: awake and alert Pain management: pain level controlled Vital Signs Assessment: post-procedure vital signs reviewed and stable Respiratory status: spontaneous breathing and nonlabored ventilation Cardiovascular status: blood pressure returned to baseline Postop Assessment: no apparent nausea or vomiting Anesthetic complications: no   No notable events documented.  Toneka Fullen [employer]

## 2021-09-23 NOTE — H&P (Signed)
[facility]   Primary Care Physician:  Inc, [facility] Ophthalmologist: Dr. Deberah Pelton  Pre-Procedure History & Physical: HPI:  Loretta Wilkins is a 76 y.o. female here for cataract surgery.   Past Medical History:  Diagnosis Date   GERD (gastroesophageal reflux disease)    Hiatal hernia    Hypertension    Multiple gastric ulcers    Wears dentures    full upper and lower    Past Surgical History:  Procedure Laterality Date   CATARACT EXTRACTION W/PHACO Right 09/02/2021   Procedure: CATARACT EXTRACTION PHACO AND INTRAOCULAR LENS PLACEMENT (IOC)RIGHT 23.70 01:53.0;  Surgeon: Estanislado Pandy, MD;  Location: [facility];  Service: Ophthalmology;  Laterality: Right;   ESOPHAGOGASTRODUODENOSCOPY (EGD) WITH PROPOFOL N/A 02/14/2018   Procedure: ESOPHAGOGASTRODUODENOSCOPY (EGD) WITH PROPOFOL;  Surgeon: Toney Reil, MD;  Location: [facility] ENDOSCOPY;  Service: Gastroenterology;  Laterality: N/A;    Prior to Admission medications   Medication Sig Start Date End Date Taking? Authorizing Provider  albuterol (PROVENTIL HFA;VENTOLIN HFA) 108 (90 Base) MCG/ACT inhaler Inhale 2 puffs into the lungs every 6 (six) hours as needed for wheezing or shortness of breath. 02/14/18   Enid Baas, MD  amLODipine (NORVASC) 2.5 MG tablet Take 2.5 mg by mouth daily.    [provider]  atorvastatin (LIPITOR) 20 MG tablet Take 20 mg by mouth daily.    [provider]  fluticasone (FLONASE) 50 MCG/ACT nasal spray Place into both nostrils daily as needed for allergies or rhinitis.    [provider]  Fluticasone-Salmeterol (ADVAIR DISKUS) 250-50 MCG/DOSE AEPB Inhale 1 puff into the lungs 2 (two) times daily. 02/14/18 08/30/21  Enid Baas, MD  hydrochlorothiazide (HYDRODIURIL) 25 MG tablet Take 25 mg by mouth daily.    [provider]  omeprazole (PRILOSEC) 40 MG capsule Take 1 capsule (40 mg total) by mouth 2 (two) times  daily. 02/14/18 09/02/21  Enid Baas, MD    Allergies as of 06/23/2021   (No Known Allergies)    Family History  Problem Relation Age of Onset   Hypertension Mother     Social History   Socioeconomic History   Marital status: Single    Spouse name: Not on file   Number of children: Not on file   Years of education: Not on file   Highest education level: Not on file  Occupational History   Not on file  Tobacco Use   Smoking status: Every Day    Packs/day: 1.00    Years: 61.00    Total pack years: 61.00    Types: Cigarettes   Smokeless tobacco: Never   Tobacco comments:    Started smoking around age 43  Vaping Use   Vaping Use: Never used  Substance and Sexual Activity   Alcohol use: No   Drug use: No   Sexual activity: Not on file  Other Topics Concern   Not on file  Social History Narrative   Not on file   Social Determinants of Health   Financial Resource Strain: Not on file  Food Insecurity: Not on file  Transportation Needs: Not on file  Physical Activity: Not on file  Stress: Not on file  Social Connections: Not on file  Intimate Partner Violence: Not on file    Review of Systems: See HPI, otherwise negative ROS  Physical Exam: There were no vitals taken for this visit. General:   Alert, cooperative in NAD Head:  Normocephalic and atraumatic. Respiratory:  Normal work  of breathing. Cardiovascular:  RRR  Impression/Plan: Loretta Wilkins is here for cataract surgery.  Risks, benefits, limitations, and alternatives regarding cataract surgery have been reviewed with the patient.  Questions have been answered.  All parties agreeable.   Estanislado Pandy, MD  09/23/2021, 11:48 AM

## 2021-09-23 NOTE — Anesthesia Preprocedure Evaluation (Signed)
Anesthesia Evaluation  Patient identified by MRN, date of birth, ID band Patient awake    Reviewed: Allergy & Precautions, NPO status , Patient's Chart, lab work & pertinent test results  Airway Mallampati: II  TM Distance: >3 FB Neck ROM: Full    Dental  (+) Lower Dentures, Upper Dentures   Pulmonary Current Smoker (61 pack years)Patient did not abstain from smoking.,    Pulmonary exam normal        Cardiovascular hypertension, Pt. on medications  Rhythm:Regular Rate:Normal     Neuro/Psych negative neurological ROS  negative psych ROS   GI/Hepatic Neg liver ROS, hiatal hernia, PUD, GERD  Controlled,  Endo/Other  negative endocrine ROS  Renal/GU negative Renal ROS     Musculoskeletal negative musculoskeletal ROS (+)   Abdominal Normal abdominal exam  (+)   Peds  Hematology negative hematology ROS (+)   Anesthesia Other Findings   Reproductive/Obstetrics                             Anesthesia Physical  Anesthesia Plan  ASA: 2  Anesthesia Plan: MAC   Post-op Pain Management: Minimal or no pain anticipated   Induction: Intravenous  PONV Risk Score and Plan: 1 and TIVA, Midazolam and Treatment may vary due to age or medical condition  Airway Management Planned: Natural Airway and Nasal Cannula  Additional Equipment:   Intra-op Plan:   Post-operative Plan:   Informed Consent: I have reviewed the patients History and Physical, chart, labs and discussed the procedure including the risks, benefits and alternatives for the proposed anesthesia with the patient or authorized representative who has indicated his/her understanding and acceptance.       Plan Discussed with: CRNA  Anesthesia Plan Comments:         Anesthesia Quick Evaluation

## 2021-09-23 NOTE — Transfer of Care (Signed)
Immediate Anesthesia Transfer of Care Note  Patient: Loretta Wilkins  Procedure(s) Performed: CATARACT EXTRACTION PHACO AND INTRAOCULAR LENS PLACEMENT (IOC) LEFT (Left: Eye)  Patient Location: PACU  Anesthesia Type: MAC  Level of Consciousness: awake, alert  and patient cooperative  Airway and Oxygen Therapy: Patient Spontanous Breathing and Patient connected to supplemental oxygen  Post-op Assessment: Post-op Vital signs reviewed, Patient's Cardiovascular Status Stable, Respiratory Function Stable, Patent Airway and No signs of Nausea or vomiting  Post-op Vital Signs: Reviewed and stable  Complications: No notable events documented.

## 2021-09-24 ENCOUNTER — Encounter: Payer: Self-pay | Admitting: Ophthalmology

## 2022-09-05 ENCOUNTER — Other Ambulatory Visit: Payer: Self-pay | Admitting: Student

## 2022-09-05 DIAGNOSIS — R519 Headache, unspecified: Secondary | ICD-10-CM

## 2022-09-05 DIAGNOSIS — H9201 Otalgia, right ear: Secondary | ICD-10-CM

## 2022-09-09 ENCOUNTER — Ambulatory Visit
Admission: RE | Admit: 2022-09-09 | Discharge: 2022-09-09 | Disposition: A | Discharge status: Home / Self Care | Payer: Medicare HMO | Source: Ambulatory Visit | Attending: Student | Admitting: Student

## 2022-09-09 DIAGNOSIS — H9201 Otalgia, right ear: Secondary | ICD-10-CM

## 2022-09-09 DIAGNOSIS — R519 Headache, unspecified: Secondary | ICD-10-CM

## 2022-09-09 MED ORDER — IOPAMIDOL (ISOVUE-300) INJECTION 61%
75.0000 mL | Freq: Once | INTRAVENOUS | Status: AC | PRN
  Administered 2022-09-09: 75 mL via INTRAVENOUS

## 2022-09-21 ENCOUNTER — Encounter: Payer: Self-pay | Admitting: Student

## 2022-11-21 ENCOUNTER — Encounter (INDEPENDENT_AMBULATORY_CARE_PROVIDER_SITE_OTHER): Payer: Self-pay | Admitting: Vascular Surgery

## 2022-11-21 NOTE — Progress Notes (Deleted)
MRN : [id number]  Loretta Wilkins is a 77 y.o. (1945-09-07) female who presents with chief complaint of check carotid arteries.  History of Present Illness:   The patient is seen for evaluation of carotid stenosis. The carotid stenosis was identified after ***.  The patient denies amaurosis fugax. There is no recent history of TIA symptoms or focal motor deficits. There is no prior documented CVA.  There is no history of migraine headaches. There is no history of seizures.  The patient is taking enteric-coated aspirin 81 mg daily.  No recent shortening of the patient's walking distance or new symptoms consistent with claudication.  No history of rest pain symptoms. No new ulcers or wounds of the lower extremities have occurred.  There is no history of DVT, PE or superficial thrombophlebitis. No recent episodes of angina or shortness of breath documented.   No outpatient medications have been marked as taking for the 11/21/22 encounter (Appointment) with Gilda Crease, Latina Craver, MD.    Past Medical History:  Diagnosis Date   GERD (gastroesophageal reflux disease)    Hiatal hernia    Hypertension    Multiple gastric ulcers    Wears dentures    full upper and lower    Past Surgical History:  Procedure Laterality Date   CATARACT EXTRACTION W/PHACO Right 09/02/2021   Procedure: CATARACT EXTRACTION PHACO AND INTRAOCULAR LENS PLACEMENT (IOC)RIGHT 23.70 01:53.0;  Surgeon: Estanislado Pandy, MD;  Location: [facility];  Service: Ophthalmology;  Laterality: Right;   CATARACT EXTRACTION W/PHACO Left 09/23/2021   Procedure: CATARACT EXTRACTION PHACO AND INTRAOCULAR LENS PLACEMENT (IOC) LEFT;  Surgeon: Estanislado Pandy, MD;  Location: [facility];  Service: Ophthalmology;  Laterality: Left;  5.70 00;41.3   ESOPHAGOGASTRODUODENOSCOPY (EGD) WITH PROPOFOL N/A 02/14/2018   Procedure: ESOPHAGOGASTRODUODENOSCOPY (EGD) WITH PROPOFOL;  Surgeon: Toney Reil, MD;  Location: [facility];  Service: Gastroenterology;  Laterality: N/A;    Social History Social History   Tobacco Use   Smoking status: Every Day    Current packs/day: 1.00    Average packs/day: 1 pack/day for 61.0 years (61.0 ttl pk-yrs)    Types: Cigarettes   Smokeless tobacco: Never   Tobacco comments:    Started smoking around age 37  Vaping Use   Vaping status: Never Used  Substance Use Topics   Alcohol use: No   Drug use: No    Family History Family History  Problem Relation Age of Onset   Hypertension Mother     No Known Allergies   REVIEW OF SYSTEMS (Negative unless checked)  Constitutional: [] Weight loss  [] Fever  [] Chills Cardiac: [] Chest pain   [] Chest pressure   [] Palpitations   [] Shortness of breath when laying flat   [] Shortness of breath with exertion. Vascular:  [x] Pain in legs with walking   [] Pain in legs at rest  [] History of DVT   [] Phlebitis   [] Swelling in legs   [] Varicose veins   [] Non-healing ulcers Pulmonary:   [] Uses home oxygen   [] Productive cough   [] Hemoptysis   [] Wheeze  [] COPD   [] Asthma Neurologic:  [] Dizziness   [] Seizures   [] History of stroke   [] History of TIA  [] Aphasia   [] Vissual changes   [] Weakness or numbness in arm   [] Weakness or numbness in leg Musculoskeletal:   [] Joint swelling   [] Joint pain   [] Low  back pain Hematologic:  [] Easy bruising  [] Easy bleeding   [] Hypercoagulable state   [] Anemic Gastrointestinal:  [] Diarrhea   [] Vomiting  [] Gastroesophageal reflux/heartburn   [] Difficulty swallowing. Genitourinary:  [] Chronic kidney disease   [] Difficult urination  [] Frequent urination   [] Blood in urine Skin:  [] Rashes   [] Ulcers  Psychological:  [] History of anxiety   []  History of major depression.  Physical Examination  There were no vitals filed for this visit. There is no height or weight on file to calculate BMI. Gen: WD/WN, NAD Head: [place]/AT, No temporalis wasting.  Ear/Nose/Throat: Hearing  grossly intact, nares w/o erythema or drainage Eyes: PER, EOMI, sclera nonicteric.  Neck: Supple, no masses.  No bruit or JVD.  Pulmonary:  Good air movement, no audible wheezing, no use of accessory muscles.  Cardiac: RRR, normal S1, S2, no Murmurs. Vascular:  carotid bruit noted Vessel Right Left  Radial Palpable Palpable  Carotid  Palpable  Palpable  Subclav  Palpable Palpable  Gastrointestinal: soft, non-distended. No guarding/no peritoneal signs.  Musculoskeletal: M/S 5/5 throughout.  No visible deformity.  Neurologic: CN 2-12 intact. Pain and light touch intact in extremities.  Symmetrical.  Speech is fluent. Motor exam as listed above. Psychiatric: Judgment intact, Mood & affect appropriate for pt's clinical situation. Dermatologic: No rashes or ulcers noted.  No changes consistent with cellulitis.   CBC Lab Results  Component Value Date   WBC 5.8 02/14/2018   HGB 12.5 02/14/2018   HCT 38.0 02/14/2018   MCV 89.6 02/14/2018   PLT 157 02/14/2018    BMET    Component Value Date/Time   NA 140 02/14/2018 0504   NA 140 09/29/2012 1247   K 3.3 (L) 02/14/2018 0504   K 3.5 09/29/2012 1247   CL 107 02/14/2018 0504   CL 107 09/29/2012 1247   CO2 26 02/14/2018 0504   CO2 29 09/29/2012 1247   GLUCOSE 106 (H) 02/14/2018 0504   GLUCOSE 76 09/29/2012 1247   BUN 13 02/14/2018 0504   BUN 10 09/29/2012 1247   CREATININE 0.61 02/14/2018 0504   CREATININE 0.63 09/29/2012 1247   CALCIUM 8.8 (L) 02/14/2018 0504   CALCIUM 9.1 09/29/2012 1247   GFRNONAA >60 02/14/2018 0504   GFRNONAA >60 09/29/2012 1247   GFRAA >60 02/14/2018 0504   GFRAA >60 09/29/2012 1247   CrCl cannot be calculated (Patient's most recent lab result is older than the maximum 21 days allowed.).  COAG No results found for: "INR", "PROTIME"  Radiology No results found.   Assessment/Plan There are no diagnoses linked to this encounter.   Levora Dredge, MD  11/21/2022 8:57 AM

## 2022-11-27 DIAGNOSIS — I6509 Occlusion and stenosis of unspecified vertebral artery: Secondary | ICD-10-CM | POA: Insufficient documentation

## 2022-11-27 DIAGNOSIS — I6529 Occlusion and stenosis of unspecified carotid artery: Secondary | ICD-10-CM | POA: Insufficient documentation

## 2022-11-27 DIAGNOSIS — I10 Essential (primary) hypertension: Secondary | ICD-10-CM | POA: Insufficient documentation

## 2022-11-27 DIAGNOSIS — J449 Chronic obstructive pulmonary disease, unspecified: Secondary | ICD-10-CM | POA: Insufficient documentation

## 2022-11-27 DIAGNOSIS — E785 Hyperlipidemia, unspecified: Secondary | ICD-10-CM | POA: Insufficient documentation

## 2022-11-28 ENCOUNTER — Ambulatory Visit (INDEPENDENT_AMBULATORY_CARE_PROVIDER_SITE_OTHER): Payer: Medicare HMO | Admitting: Vascular Surgery

## 2022-11-28 ENCOUNTER — Encounter (INDEPENDENT_AMBULATORY_CARE_PROVIDER_SITE_OTHER): Payer: Self-pay | Admitting: Vascular Surgery

## 2022-11-28 VITALS — BP 163/90 | HR 83 | Resp 16 | Ht 65.5 in | Wt 165.6 lb

## 2022-11-28 DIAGNOSIS — I6523 Occlusion and stenosis of bilateral carotid arteries: Secondary | ICD-10-CM

## 2022-11-28 DIAGNOSIS — I10 Essential (primary) hypertension: Secondary | ICD-10-CM

## 2022-11-28 DIAGNOSIS — J449 Chronic obstructive pulmonary disease, unspecified: Secondary | ICD-10-CM

## 2022-11-28 DIAGNOSIS — I6501 Occlusion and stenosis of right vertebral artery: Secondary | ICD-10-CM | POA: Diagnosis not present

## 2022-11-28 DIAGNOSIS — E782 Mixed hyperlipidemia: Secondary | ICD-10-CM | POA: Diagnosis not present

## 2022-12-16 ENCOUNTER — Encounter (INDEPENDENT_AMBULATORY_CARE_PROVIDER_SITE_OTHER): Payer: Self-pay | Admitting: Vascular Surgery

## 2023-01-16 ENCOUNTER — Emergency Department: Payer: Medicare HMO

## 2023-01-16 ENCOUNTER — Other Ambulatory Visit: Payer: Self-pay

## 2023-01-16 ENCOUNTER — Emergency Department
Admission: EM | Admit: 2023-01-16 | Discharge: 2023-01-16 | Disposition: A | Discharge status: Home / Self Care | Payer: Medicare HMO | Attending: Emergency Medicine | Admitting: Emergency Medicine

## 2023-01-16 DIAGNOSIS — I10 Essential (primary) hypertension: Secondary | ICD-10-CM | POA: Diagnosis not present

## 2023-01-16 DIAGNOSIS — R0981 Nasal congestion: Secondary | ICD-10-CM | POA: Diagnosis not present

## 2023-01-16 DIAGNOSIS — H9201 Otalgia, right ear: Secondary | ICD-10-CM | POA: Diagnosis not present

## 2023-01-16 DIAGNOSIS — Z1152 Encounter for screening for COVID-19: Secondary | ICD-10-CM | POA: Diagnosis not present

## 2023-01-16 DIAGNOSIS — G8929 Other chronic pain: Secondary | ICD-10-CM | POA: Insufficient documentation

## 2023-01-16 DIAGNOSIS — R0989 Other specified symptoms and signs involving the circulatory and respiratory systems: Secondary | ICD-10-CM

## 2023-01-16 DIAGNOSIS — R0602 Shortness of breath: Secondary | ICD-10-CM | POA: Insufficient documentation

## 2023-01-16 LAB — RESP PANEL BY RT-PCR (RSV, FLU A&B, COVID)  RVPGX2
Influenza A by PCR: NEGATIVE
Influenza B by PCR: NEGATIVE
Resp Syncytial Virus by PCR: NEGATIVE
SARS Coronavirus 2 by RT PCR: NEGATIVE

## 2023-01-16 MED ORDER — AMOXICILLIN-POT CLAVULANATE 875-125 MG PO TABS
1.0000 | ORAL_TABLET | Freq: Two times a day (BID) | ORAL | 0 refills | Status: AC
Start: 2023-01-16 — End: 2023-01-23

## 2023-01-16 NOTE — ED Provider Notes (Addendum)
[facility] Provider Note   Event Date/Time   First MD Initiated Contact with Patient 01/16/23 1459     (approximate) History  Shortness of Breath  HPI Loretta Wilkins is a 77 y.o. female with a past medical history of GERD and hypertension who presents complaining of cough and shortness of breath x 2 weeks after being exposed to a fan member who tested positive for COVID recently.  Patient states that she has had a fever at home and has had productive cough of yellow/green sputum.  Patient endorses dyspnea on exertion and generalized headache as well as right ear pain that is developed over the last 2 days as well. ROS: Patient currently denies any vision changes, tinnitus, difficulty speaking, facial droop, abdominal pain, nausea/vomiting/diarrhea, dysuria, or weakness/numbness/paresthesias in any extremity   Physical Exam  Triage Vital Signs: ED Triage Vitals  Encounter Vitals Group     BP 01/16/23 1218 (!) 160/100     Systolic BP Percentile --      Diastolic BP Percentile --      Pulse Rate 01/16/23 1218 (!) 124     Resp 01/16/23 1218 19     Temp 01/16/23 1218 98.5 F (36.9 C)     Temp Source 01/16/23 1218 Oral     SpO2 01/16/23 1218 96 %     Weight 01/16/23 1219 165 lb 9.1 oz (75.1 kg)     Height 01/16/23 1219 5' 5.5" (1.664 m)     Head Circumference --      Peak Flow --      Pain Score 01/16/23 1219 4     Pain Loc --      Pain Education --      Exclude from Growth Chart --    Most recent vital signs: Vitals:   01/16/23 1435 01/16/23 1535  BP: (!) 147/79 (!) 154/90  Pulse: (!) 106 99  Resp:  18  Temp:    SpO2: 97% 97%   General: Awake, oriented x4. CV:  Good peripheral perfusion.  Resp:  Normal effort.  Abd:  No distention.  Other:  Elderly overweight African-American female resting comfortably in no acute distress ED Results / Procedures / Treatments  Labs (all labs ordered are listed, but only abnormal results are displayed) Labs  Reviewed  RESP PANEL BY RT-PCR (RSV, FLU A&B, COVID)  [id number]   EKG ED ECG REPORT I, Merwyn Katos, the attending physician, personally viewed and interpreted this ECG. Date: 01/16/2023 EKG Time: 1221 Rate: 104 Rhythm: normal sinus rhythm QRS Axis: normal Intervals: normal ST/T Wave abnormalities: normal Narrative Interpretation: no evidence of acute ischemia RADIOLOGY ED MD interpretation: 2 view chest x-ray interpreted by me shows no evidence of acute abnormalities including no pneumonia, pneumothorax, or widened mediastinum -Agree with radiology assessment Official radiology report(s): No results found. PROCEDURES: Critical Care performed: No .1-3 Lead EKG Interpretation  Performed by: Merwyn Katos, MD Authorized by: Merwyn Katos, MD     Interpretation: normal     ECG rate:  91   ECG rate assessment: normal     Rhythm: sinus rhythm     Ectopy: none     Conduction: normal    MEDICATIONS ORDERED IN ED: Medications - No data to display IMPRESSION / MDM / ASSESSMENT AND PLAN / ED COURSE  I reviewed the triage vital signs and the nursing notes.  The patient is on the cardiac monitor to evaluate for evidence of arrhythmia and/or significant heart rate changes. Patient's presentation is most consistent with acute presentation with potential threat to life or bodily function. Otherwise healthy patient presenting with constellation of symptoms likely representing uncomplicated viral upper respiratory symptoms as characterized by mild pharyngitis  Unlikely PTA/RPA: no hot potato voice, no uvular deviation, Unlikely Esophageal rupture: No history of dysphagia Unlikely deep space infection/Ludwig's Low suspicion for CNS infection bacterial sinusitis, or pneumonia given exam and history.  Unlikely Strep or EBV as centor negative and with no pharyngeal exudate, posterior LAD, or splenomegaly.  Given duration of symptoms, will treat empirically  with a short course of Augmentin.  No respiratory distress, otherwise relatively well appearing and nontoxic. Will discuss prompt follow up with PMD and strict return precautions.   FINAL CLINICAL IMPRESSION(S) / ED DIAGNOSES   Final diagnoses:  SOB (shortness of breath)  Chest congestion  Chronic right ear pain   Rx / DC Orders   ED Discharge Orders          Ordered    amoxicillin-clavulanate (AUGMENTIN) 875-125 MG tablet  2 times daily        01/16/23 1529           Note:  This document was prepared using Dragon voice recognition software and may include unintentional dictation errors.   Merwyn Katos, MD 01/16/23 1541    Merwyn Katos, MD 01/16/23 [phone]

## 2023-01-16 NOTE — ED Triage Notes (Signed)
Pt here with a cough and SOB x2 weeks. Pt states her family tested positive for covid recently. Pt also states she had a fever at home. Pt states she has been coughing and having chills.

## 2023-09-06 ENCOUNTER — Other Ambulatory Visit: Payer: Self-pay

## 2023-09-06 DIAGNOSIS — Z780 Asymptomatic menopausal state: Secondary | ICD-10-CM

## 2023-11-30 ENCOUNTER — Ambulatory Visit (INDEPENDENT_AMBULATORY_CARE_PROVIDER_SITE_OTHER): Payer: Medicare HMO | Admitting: Vascular Surgery

## 2023-11-30 ENCOUNTER — Encounter (INDEPENDENT_AMBULATORY_CARE_PROVIDER_SITE_OTHER): Payer: Medicare HMO
